# Patient Record
Sex: Female | Born: 1985 | Race: White | Hispanic: No | Marital: Single | Smoking: Never smoker
Health system: Southern US, Community
[De-identification: ages and names within clinical notes are randomized; demographics above are authoritative.]

## PROBLEM LIST (undated history)

## (undated) DIAGNOSIS — F419 Anxiety disorder, unspecified: Secondary | ICD-10-CM

## (undated) DIAGNOSIS — F32A Depression, unspecified: Secondary | ICD-10-CM

---

## 2007-09-26 ENCOUNTER — Ambulatory Visit: Payer: Self-pay | Admitting: Family Medicine

## 2007-09-26 DIAGNOSIS — R51 Headache: Secondary | ICD-10-CM

## 2007-09-26 DIAGNOSIS — R519 Headache, unspecified: Secondary | ICD-10-CM | POA: Insufficient documentation

## 2007-09-26 DIAGNOSIS — M545 Low back pain: Secondary | ICD-10-CM

## 2007-09-26 DIAGNOSIS — R21 Rash and other nonspecific skin eruption: Secondary | ICD-10-CM

## 2007-09-27 ENCOUNTER — Encounter: Payer: Self-pay | Admitting: Family Medicine

## 2007-10-02 ENCOUNTER — Ambulatory Visit: Payer: Self-pay | Admitting: Family Medicine

## 2007-10-23 ENCOUNTER — Ambulatory Visit: Payer: Self-pay | Admitting: Family Medicine

## 2007-10-23 DIAGNOSIS — N771 Vaginitis, vulvitis and vulvovaginitis in diseases classified elsewhere: Secondary | ICD-10-CM

## 2007-10-23 DIAGNOSIS — N926 Irregular menstruation, unspecified: Secondary | ICD-10-CM

## 2007-10-23 LAB — CONVERTED CEMR LAB
Beta hcg, urine, semiquantitative: NEGATIVE
KOH Prep: 0

## 2007-10-24 ENCOUNTER — Encounter: Payer: Self-pay | Admitting: Family Medicine

## 2007-10-24 LAB — CONVERTED CEMR LAB
Chlamydia, DNA Probe: NEGATIVE
GC Probe Amp, Genital: NEGATIVE

## 2014-12-05 NOTE — L&D Delivery Note (Addendum)
Delivery Note  First Stage: Labor onset: SROM at 0500 Augmentation : Pitocin  Analgesia /Anesthesia intrapartum: 2 doses of Stadol IVP and Epidural  SROM at 0500 at home - clear fluid, but progressed to light meconium throughout the day   Second Stage: Complete dilation at 1845 Onset of pushing at 1915 FHR second stage: category 2 tracing: fetal tachycardia with baseline in 160s -180s with pushing / variable and late decelerations during pushing to a nadir of 120 bpm  Delivery of a viable female "Victoria Cervantes" at 2012 by Victoria Cervantes CNM in LOA with compound left hand presentation / large amounts of thick meconium expressed during delivery of fetal head and body / Dr. Dalbert GarnetBeasley in the delivery room as back-up / NICU and nursery at bedside   No nuchal cord / cord around body  Cord double clamped after cessation of pulsation, cut by FOB Cord blood sample collected - mom AB Negative  Arterial cord blood sample - sent  Third Stage: Meconium-Stained Placenta delivered via [name] intact with 3 VC @ 2018 Placenta disposition: surgical pathology  Uterine tone firm with Pitocin and fundal massage/ bleeding was brisk, but then slowed to normal after pitocin and fundal massage   Right periclitoral laceration identified  Anesthesia for repair: epidural and 10 cc of 1% Lidocaine used  Repair: 3.0 vicryl with good hemostasis Est. Blood Loss (mL): 250cc  Complications: diagnosis of intra-amniotic infection during second stage pushing with maternal fever at 99.29F and maternal and fetal tachycardia with fetal baseline: 160s and increased to 180s with pushing / Tylenol was given during second stage - plan to treat as chorioamnionitis with Ampicillin 2 grams every 6 hours for 24 hours and Gentamycin 5mg /kg =350mg  for 1 dose   Mom to postpartum.  Baby to NICU.  Newborn: Birth Weight: 7#4.8oz  Apgar Scores: 8, 9 Feeding planned: Breast Cord Gas sent - pH 7.33  Neonatal NP at bedside for delivery -  performed oral and nares bulb suctioning - baby continued to grunt and oxygen levels were borderline, so baby taken to SCN   Victoria JewsMeredith Jerrel Tiberio, CNM

## 2015-06-18 DIAGNOSIS — Z3403 Encounter for supervision of normal first pregnancy, third trimester: Secondary | ICD-10-CM | POA: Insufficient documentation

## 2015-06-19 DIAGNOSIS — Z6791 Unspecified blood type, Rh negative: Secondary | ICD-10-CM

## 2015-06-19 DIAGNOSIS — O26899 Other specified pregnancy related conditions, unspecified trimester: Secondary | ICD-10-CM | POA: Insufficient documentation

## 2015-06-25 ENCOUNTER — Ambulatory Visit: Payer: Self-pay

## 2015-06-29 ENCOUNTER — Ambulatory Visit
Admission: RE | Admit: 2015-06-29 | Discharge: 2015-06-29 | Disposition: A | Discharge status: Home / Self Care | Payer: Medicaid Other | Source: Ambulatory Visit | Attending: Obstetrics and Gynecology | Admitting: Obstetrics and Gynecology

## 2015-06-29 ENCOUNTER — Ambulatory Visit: Payer: Self-pay

## 2015-06-29 ENCOUNTER — Inpatient Hospital Stay: Admission: RE | Admit: 2015-06-29 | Payer: Self-pay | Source: Ambulatory Visit

## 2015-06-29 DIAGNOSIS — O359XX0 Maternal care for (suspected) fetal abnormality and damage, unspecified, not applicable or unspecified: Secondary | ICD-10-CM | POA: Insufficient documentation

## 2015-06-29 DIAGNOSIS — Z3A18 18 weeks gestation of pregnancy: Secondary | ICD-10-CM | POA: Diagnosis not present

## 2015-06-29 LAB — US OB DETAIL + 14 WK

## 2015-07-02 ENCOUNTER — Other Ambulatory Visit: Payer: Self-pay

## 2015-11-04 DIAGNOSIS — B951 Streptococcus, group B, as the cause of diseases classified elsewhere: Secondary | ICD-10-CM | POA: Insufficient documentation

## 2015-11-22 ENCOUNTER — Encounter: Payer: Self-pay | Admitting: *Deleted

## 2015-11-22 ENCOUNTER — Inpatient Hospital Stay
Admission: RE | Admit: 2015-11-22 | Discharge: 2015-11-24 | DRG: 775 | Disposition: A | Discharge status: Home / Self Care | Payer: Medicaid Other | Source: Intra-hospital | Attending: Obstetrics and Gynecology | Admitting: Obstetrics and Gynecology

## 2015-11-22 ENCOUNTER — Inpatient Hospital Stay: Payer: Medicaid Other | Admitting: Anesthesiology

## 2015-11-22 DIAGNOSIS — Z3A39 39 weeks gestation of pregnancy: Secondary | ICD-10-CM | POA: Diagnosis not present

## 2015-11-22 DIAGNOSIS — O411230 Chorioamnionitis, third trimester, not applicable or unspecified: Secondary | ICD-10-CM | POA: Diagnosis present

## 2015-11-22 DIAGNOSIS — O326XX0 Maternal care for compound presentation, not applicable or unspecified: Principal | ICD-10-CM | POA: Diagnosis present

## 2015-11-22 DIAGNOSIS — O99824 Streptococcus B carrier state complicating childbirth: Secondary | ICD-10-CM | POA: Diagnosis present

## 2015-11-22 DIAGNOSIS — Z3A28 28 weeks gestation of pregnancy: Secondary | ICD-10-CM | POA: Diagnosis present

## 2015-11-22 LAB — TYPE AND SCREEN
ABO/RH(D): AB NEG
ANTIBODY SCREEN: POSITIVE

## 2015-11-22 LAB — CBC
HEMATOCRIT: 38.2 % (ref 35.0–47.0)
HEMOGLOBIN: 12.6 g/dL (ref 12.0–16.0)
MCH: 28.1 pg (ref 26.0–34.0)
MCHC: 32.8 g/dL (ref 32.0–36.0)
MCV: 85.6 fL (ref 80.0–100.0)
Platelets: 144 10*3/uL — ABNORMAL LOW (ref 150–440)
RBC: 4.47 MIL/uL (ref 3.80–5.20)
RDW: 14.4 % (ref 11.5–14.5)
WBC: 9.4 10*3/uL (ref 3.6–11.0)

## 2015-11-22 LAB — URINALYSIS COMPLETE WITH MICROSCOPIC (ARMC ONLY)
BILIRUBIN URINE: NEGATIVE
GLUCOSE, UA: NEGATIVE mg/dL
Nitrite: NEGATIVE
Protein, ur: 100 mg/dL — AB
SPECIFIC GRAVITY, URINE: 1.019 (ref 1.005–1.030)
pH: 6 (ref 5.0–8.0)

## 2015-11-22 LAB — ABO/RH: ABO/RH(D): AB NEG

## 2015-11-22 LAB — CHLAMYDIA/NGC RT PCR (ARMC ONLY)
Chlamydia Tr: NOT DETECTED
N GONORRHOEAE: NOT DETECTED

## 2015-11-22 MED ORDER — PENICILLIN G POTASSIUM 5000000 UNITS IJ SOLR
2.5000 10*6.[IU] | INTRAMUSCULAR | Status: DC
  Administered 2015-11-22 (×2): 2.5 10*6.[IU] via INTRAVENOUS
  Filled 2015-11-22 (×2): qty 2.5

## 2015-11-22 MED ORDER — MISOPROSTOL 200 MCG PO TABS
ORAL_TABLET | ORAL | Status: AC
  Filled 2015-11-22: qty 4

## 2015-11-22 MED ORDER — OXYTOCIN 40 UNITS IN LACTATED RINGERS INFUSION - SIMPLE MED
1.0000 m[IU]/min | INTRAVENOUS | Status: DC
  Administered 2015-11-22: 1 m[IU]/min via INTRAVENOUS

## 2015-11-22 MED ORDER — OXYCODONE-ACETAMINOPHEN 5-325 MG PO TABS: 1.0000 | ORAL_TABLET | ORAL | Status: DC | PRN

## 2015-11-22 MED ORDER — AMPICILLIN SODIUM 2 G IJ SOLR
2.0000 g | Freq: Four times a day (QID) | INTRAMUSCULAR | Status: AC
Start: 2015-11-22 — End: 2015-11-23
  Administered 2015-11-22 – 2015-11-23 (×4): 2 g via INTRAVENOUS
  Filled 2015-11-22 (×4): qty 2000

## 2015-11-22 MED ORDER — ONDANSETRON HCL 4 MG/2ML IJ SOLN: 4.0000 mg | INTRAMUSCULAR | Status: DC | PRN

## 2015-11-22 MED ORDER — FENTANYL 2.5 MCG/ML W/ROPIVACAINE 0.2% IN NS 100 ML EPIDURAL INFUSION (ARMC-ANES)
EPIDURAL | Status: AC
  Filled 2015-11-22: qty 100

## 2015-11-22 MED ORDER — OXYTOCIN 10 UNIT/ML IJ SOLN
INTRAMUSCULAR | Status: AC
  Filled 2015-11-22: qty 2

## 2015-11-22 MED ORDER — ONDANSETRON HCL 4 MG PO TABS: 4.0000 mg | ORAL_TABLET | ORAL | Status: DC | PRN

## 2015-11-22 MED ORDER — OXYTOCIN 40 UNITS IN LACTATED RINGERS INFUSION - SIMPLE MED
62.5000 mL/h | INTRAVENOUS | Status: DC
  Administered 2015-11-22: 62.5 mL/h via INTRAVENOUS
  Filled 2015-11-22: qty 1000

## 2015-11-22 MED ORDER — PENICILLIN G POTASSIUM 5000000 UNITS IJ SOLR
5.0000 10*6.[IU] | Freq: Once | INTRAVENOUS | Status: AC
  Administered 2015-11-22: 5 10*6.[IU] via INTRAVENOUS
  Filled 2015-11-22: qty 5

## 2015-11-22 MED ORDER — IBUPROFEN 600 MG PO TABS
600.0000 mg | ORAL_TABLET | Freq: Four times a day (QID) | ORAL | Status: DC
  Administered 2015-11-23 – 2015-11-24 (×7): 600 mg via ORAL
  Filled 2015-11-22 (×7): qty 1

## 2015-11-22 MED ORDER — OXYTOCIN BOLUS FROM INFUSION
500.0000 mL | INTRAVENOUS | Status: DC
  Administered 2015-11-22: 500 mL via INTRAVENOUS

## 2015-11-22 MED ORDER — ZOLPIDEM TARTRATE 5 MG PO TABS: 5.0000 mg | ORAL_TABLET | Freq: Every evening | ORAL | Status: DC | PRN

## 2015-11-22 MED ORDER — LANOLIN HYDROUS EX OINT: TOPICAL_OINTMENT | CUTANEOUS | Status: DC | PRN

## 2015-11-22 MED ORDER — LACTATED RINGERS IV SOLN
INTRAVENOUS | Status: DC
  Administered 2015-11-22: 125 mL/h via INTRAVENOUS
  Administered 2015-11-23: 09:00:00 via INTRAVENOUS

## 2015-11-22 MED ORDER — SENNOSIDES-DOCUSATE SODIUM 8.6-50 MG PO TABS
2.0000 | ORAL_TABLET | ORAL | Status: DC
  Administered 2015-11-24: 2 via ORAL
  Filled 2015-11-22: qty 2

## 2015-11-22 MED ORDER — BENZOCAINE-MENTHOL 20-0.5 % EX AERO: 1.0000 "application " | INHALATION_SPRAY | CUTANEOUS | Status: DC | PRN

## 2015-11-22 MED ORDER — GENTAMICIN SULFATE 40 MG/ML IJ SOLN
350.0000 mg | Freq: Once | INTRAVENOUS | Status: AC
  Administered 2015-11-23: 350 mg via INTRAVENOUS
  Filled 2015-11-22: qty 8.75

## 2015-11-22 MED ORDER — LIDOCAINE HCL (PF) 1 % IJ SOLN
30.0000 mL | INTRAMUSCULAR | Status: DC | PRN
  Administered 2015-11-22: 30 mL via SUBCUTANEOUS
  Filled 2015-11-22: qty 30

## 2015-11-22 MED ORDER — ACETAMINOPHEN 325 MG PO TABS
650.0000 mg | ORAL_TABLET | ORAL | Status: DC | PRN
  Administered 2015-11-22: 650 mg via ORAL
  Filled 2015-11-22: qty 2

## 2015-11-22 MED ORDER — LACTATED RINGERS IV SOLN: 500.0000 mL | INTRAVENOUS | Status: DC | PRN

## 2015-11-22 MED ORDER — WITCH HAZEL-GLYCERIN EX PADS: 1.0000 "application " | MEDICATED_PAD | CUTANEOUS | Status: DC | PRN

## 2015-11-22 MED ORDER — BUTORPHANOL TARTRATE 1 MG/ML IJ SOLN
1.0000 mg | INTRAMUSCULAR | Status: DC | PRN
  Administered 2015-11-22 (×2): 1 mg via INTRAVENOUS
  Filled 2015-11-22 (×2): qty 1

## 2015-11-22 MED ORDER — DIPHENHYDRAMINE HCL 25 MG PO CAPS: 25.0000 mg | ORAL_CAPSULE | Freq: Four times a day (QID) | ORAL | Status: DC | PRN

## 2015-11-22 MED ORDER — DIBUCAINE 1 % RE OINT: 1.0000 "application " | TOPICAL_OINTMENT | RECTAL | Status: DC | PRN

## 2015-11-22 MED ORDER — SIMETHICONE 80 MG PO CHEW: 80.0000 mg | CHEWABLE_TABLET | ORAL | Status: DC | PRN

## 2015-11-22 MED ORDER — BUPIVACAINE HCL (PF) 0.25 % IJ SOLN
INTRAMUSCULAR | Status: DC | PRN
  Administered 2015-11-22: 10 mL via EPIDURAL

## 2015-11-22 MED ORDER — TERBUTALINE SULFATE 1 MG/ML IJ SOLN: 0.2500 mg | Freq: Once | INTRAMUSCULAR | Status: DC | PRN

## 2015-11-22 MED ORDER — OXYCODONE-ACETAMINOPHEN 5-325 MG PO TABS
2.0000 | ORAL_TABLET | ORAL | Status: DC | PRN
  Administered 2015-11-23 – 2015-11-24 (×2): 2 via ORAL
  Filled 2015-11-22 (×2): qty 2

## 2015-11-22 MED ORDER — LIDOCAINE-EPINEPHRINE (PF) 1.5 %-1:200000 IJ SOLN
INTRAMUSCULAR | Status: DC | PRN
  Administered 2015-11-22: 3 mL via PERINEURAL

## 2015-11-22 MED ORDER — ONDANSETRON HCL 4 MG/2ML IJ SOLN: 4.0000 mg | Freq: Four times a day (QID) | INTRAMUSCULAR | Status: DC | PRN

## 2015-11-22 MED ORDER — PRENATAL MULTIVITAMIN CH
1.0000 | ORAL_TABLET | Freq: Every day | ORAL | Status: DC
  Administered 2015-11-24: 1 via ORAL
  Filled 2015-11-22: qty 1

## 2015-11-22 MED ORDER — FENTANYL 2.5 MCG/ML W/ROPIVACAINE 0.2% IN NS 100 ML EPIDURAL INFUSION (ARMC-ANES)
EPIDURAL | Status: AC
  Administered 2015-11-22: 10 mL/h via EPIDURAL
  Filled 2015-11-22: qty 100

## 2015-11-22 MED ORDER — ACETAMINOPHEN 325 MG PO TABS: 650.0000 mg | ORAL_TABLET | ORAL | Status: DC | PRN

## 2015-11-22 NOTE — Progress Notes (Signed)
S:  More uncomfortable and requesting pain medication       Received 500cc LR Bolus   O:  VS: Blood pressure 131/66, pulse 86, temperature 97.6 F (36.4 C), temperature source Oral, resp. rate 14, last menstrual period 02/17/2015.        FHR : baseline 140 / variability Moderate/ accelerations + / no decelerations        Toco: contractions every 3-5 minutes / Moderate         Cervix : Dilation: 3 Effacement (%): 80 Station: -3 Presentation: Vertex Exam by:: ess        Membranes: SROM  A: Latent labor     FHR category 2  P: Stadol IVP given x 1     Continue expectant management     Limit vaginal exams d/t rupture      Anticipate NSVD  Carlean JewsMeredith Sigmon, CNM

## 2015-11-22 NOTE — Plan of Care (Signed)
Pt sitting up for epidural

## 2015-11-22 NOTE — Progress Notes (Signed)
Pt assisted to bathroom, voided, shown pericare.   Clean pad icepack and gown on.  She the sat in w/c in stable condition

## 2015-11-22 NOTE — Progress Notes (Addendum)
S:  Breathing well through contractions, and had one more dose of Stadol / still does not want epidural at this time  O:  VS: Blood pressure 130/76, pulse 82, temperature 98.1 F (36.7 C), temperature source Oral, resp. rate 14, last menstrual period 02/17/2015.        FHR : baseline 140 bpm / variability Moderate / accelerations + / occasional early decelerations        Toco: contractions every 5-7nutes / moderate        Cervix : deferred        Membranes: SROM - light mec per RN last exam  A: Latent Labor      FHR category 2     GBS positive  P: Continue management - discussed starting low-dose Pitocin to get contractions stronger and closer together      Has received 2 doses of abx      Re-assess in 1-2 hours     Anticipate NSVD  Carlean JewsMeredith Bruna Dills, CNM

## 2015-11-22 NOTE — Progress Notes (Signed)
S:  Assessing patient again and she states the IV pain medication is not as strong and would like an epidural   O:  VS: Blood pressure 130/76, pulse 82, temperature 98.1 F (36.7 C), temperature source Oral, resp. rate 14, last menstrual period 02/17/2015.        FHR : baseline 130- 135 / variability Moderate / accelerations + / no decelerations        Toco: contractions every 5-7 minutes / moderate        Cervix : 4.5cm/70%/-2/vtx        Membranes: SROM - light meconium stained fluid on towels now since 1015 am   A: Latent labor     FHR category 1     GBS positive   P: Discussed meconium stained fluid with patient and FOB - FHR reassuring at this time and if things change, we will plan on placing an IUPC - discussed risks and benefits - they are amenable to this plan      May get an epidural     Begin low-dose Pitocin - start at 1 milliunit and increase by 2 milliunits     Reassess in 1-2 hours     Dr. Dalbert GarnetBeasley updated on status      Carlean JewsMeredith Ardie Mclennan, CNM

## 2015-11-22 NOTE — Progress Notes (Signed)
S:  Pt. Is comfortable with her epidural on her right side        Discussed with pt. That we were having some difficulty tracing contractions and with her being 4-5 cm since about 1pm, we need to place an IUPC to measure the quality of contractions - educated her on the risks and benefits and she is okay to proceed.  We also discussed placing an FSE to monitor fetal heart rate and she agrees to that as well - again reviewed risks and benefits  O:  VS: Blood pressure 100/52, pulse 91, temperature 97.8 F (36.6 C), temperature source Oral, resp. rate 14, last menstrual period 02/17/2015.        FHR : baseline 145 / variability minimal-moderate / accelerations none / occasional early and variable decelerations        Toco: contractions every 1-6 minutes / palpating mild to moderate/ MVUs: 180         Cervix : 5cm/70%/-2/vtx        Membranes: AROM - light meconium   A: Latent labor     FHR category 2     GBS positive - has received 3 doses of abx   P: IUPC placed with ease      Failed attempt at an FSE - did not repeat / will continue monitoring FHR externally     Continue frequent position changes      LR 500cc bolus after dose of antibiotics are in for minimal variability      Dr. Dalbert GarnetBeasley updated and aware      Anticipate NSVD  Carlean JewsMeredith Winifred Balogh, CNM

## 2015-11-22 NOTE — Progress Notes (Signed)
S:  Pt. Reports stronger contractions now and rating them 6/10       Discussed pain control and pt. Desires IV pain medication / she is not opposed to epidural, but is hoping to not get it  O:  VS: Blood pressure 126/72, pulse 86, temperature 97.6 F (36.4 C), temperature source Oral, resp. rate 14, last menstrual period 02/17/2015.        FHR : baseline 140 bpm / variability Moderate/ accelerations 10x10 / no decelerations        Toco: contractions every 2-4.5 minutes / moderate to palpate        Cervix : Dilation: 1 Effacement (%): 50 Station: -3 Presentation: Vertex Exam by:: ess        Membranes: SROM at home @0500  - clear fluid   A: Latent labor     FHR category 2 - reassuring, but not reactive     GBS positive  P: First dose of PCN going now     Stadol IVP PRN     500cc IV fluid bolus after antibiotic dose      Hold off on Pitocin augmentation for now d/t pt. Spontaneously contracting     Reassess in 1-2 hours     Anticipate NSVD  Carlean JewsMeredith Orlena Garmon, CNM

## 2015-11-22 NOTE — Progress Notes (Signed)
Patient ID: Victoria Cervantes, female   DOB: 05/31/1986, 29 y.o.   MRN: [id number]  Addendum to delivery note by Carlean JewsMeredith Sigmon, CNM:  I was present and scrubbed at the perineum for the entirety of the vaginal delivery procedure.  Cat II strip with excellent maternal pushing effort, fetal tachycardia with maternal fever treated with acetaminophen prior to delivery and no recorded temp in the computer. Dx of IAI made during pushing. Abx arrived to begin maternal treatment after delivery.  Compound presentation with thick meconium. Cord gas, cord blood and placenta sent to pathology. Maternal bleeding normal with firm fundus and postpartum pitocin given.  Baby with apgars of 8 and 9 at 1 and 5 min respectively. Some grunting after delivery and baby taken to SCN.  Christeen DouglasBethany Lezlee Gills, MD

## 2015-11-22 NOTE — Anesthesia Preprocedure Evaluation (Signed)
Anesthesia Evaluation  Patient identified by MRN, date of birth, ID band Patient awake    Reviewed: Allergy & Precautions, NPO status , Patient's Chart, lab work & pertinent test results  Airway Mallampati: II   Neck ROM: Full    Dental no notable dental hx.    Pulmonary neg pulmonary ROS,    Pulmonary exam normal breath sounds clear to auscultation       Cardiovascular negative cardio ROS Normal cardiovascular exam     Neuro/Psych  Headaches, negative psych ROS   GI/Hepatic negative GI ROS, Neg liver ROS,   Endo/Other  negative endocrine ROS  Renal/GU negative Renal ROS  negative genitourinary   Musculoskeletal negative musculoskeletal ROS (+)   Abdominal Normal abdominal exam  (+)   Peds negative pediatric ROS (+)  Hematology negative hematology ROS (+)   Anesthesia Other Findings   Reproductive/Obstetrics (+) Pregnancy                             Anesthesia Physical Anesthesia Plan  ASA: II  Anesthesia Plan: Epidural   Post-op Pain Management:    Induction:   Airway Management Planned:   Additional Equipment:   Intra-op Plan:   Post-operative Plan:   Informed Consent: I have reviewed the patients History and Physical, chart, labs and discussed the procedure including the risks, benefits and alternatives for the proposed anesthesia with the patient or authorized representative who has indicated his/her understanding and acceptance.   Dental advisory given  Plan Discussed with: Surgeon  Anesthesia Plan Comments:         Anesthesia Quick Evaluation

## 2015-11-22 NOTE — H&P (Signed)
  OB ADMISSION/ HISTORY & PHYSICAL:  Admission Date: 11/22/2015  5:31 AM  Admit Diagnosis: Spontaneous rupture of membranes at 39+5 weeks  Victoria Cervantes is a 29 y.o. female presenting for spontaneous rupture of membranes at 39+5 weeks.  She reports her "water broke around 5am," but denies frequent, strong contractions.  She came directly to the hospital because she is GBS positive.   Prenatal History: G1P0   EDC : 11/24/2015, by Last Menstrual Period of 02/17/15 [facility]  Prenatal course complicated by late entry to prenatal care at 17 weeks, Rh Negative, GBS positive  Prenatal Labs: ABO, Rh:  AB Negative Antibody:  Negative Rubella:   Immune RPR:   NR HBsAg:   NR HIV:   NR GTT: 103 GBS:   Positive   Medical / Surgical History :  Past medical history: History reviewed. No pertinent past medical history.   Past surgical history: History reviewed. No pertinent past surgical history.  Family History: History reviewed. No pertinent family history.   Social History:  reports that she has never smoked. She has never used smokeless tobacco. She reports that she does not drink alcohol or use illicit drugs.   Allergies: Review of patient's allergies indicates no known allergies.    Current Medications at time of admission:  Prior to Admission medications   Medication Sig Start Date End Date Taking? Authorizing Provider  ferrous fumarate (HEMOCYTE - 106 MG FE) 325 (106 FE) MG TABS tablet Take 1 tablet by mouth.   Yes Historical Provider, MD  acetaminophen (TYLENOL) 325 MG tablet Take 650 mg by mouth every 6 (six) hours as needed for mild pain.    Historical Provider, MD  Prenatal Vit-Fe Fumarate-FA (PRENATAL MULTIVITAMIN) TABS tablet Take 1 tablet by mouth daily at 12 noon.    Historical Provider, MD     Review of Systems: Active FM onset of ctx @ 0500 currently every 6-10 minutes LOF  / SROM @ 0500 bloody show: none   Physical Exam:  VS: Blood  pressure 131/83, pulse 97, temperature 98 F (36.7 C), temperature source Oral, resp. rate 14, last menstrual period 02/17/2015.  General: alert and oriented, appears calm Heart: RRR Lungs: Clear lung fields Abdomen: Gravid, soft and non-tender, non-distended / uterus: gravid, non-tender / Leopold's EFW: 7#6oz/ vtx. Extremities: no edema  Genitalia / VE: Dilation: 1 Effacement (%): 50 Station: -3 Exam by:: ess  FHR: baseline rate 130-135 bpm / variability Moderate / accelerations + / no decelerations TOCO: every 2- 4.5 mintues  Assessment: 39+[redacted] weeks gestation Latent stage of labor FHR category 1 SROM  GBS positive - begin PCN prophylaxis  RH Negative - received Rhogam at 28 weeks, needs PP if baby Rh positive Late entry into prenatal care at 17 weeks    Plan:  Admit to Birth Place Routine Antenatal orders  PCN prophylaxis for GBS positive  May have Stadol PRN  Continuous fetal monitoring  Begin Pitocin IV: start at 1 milliunit and increase by 2 milliunits  Anticipate NSVD  Dr. Dalbert GarnetBeasley notified of admission / plan of care  Carlean JewsMeredith Shatisha Falter, CNM

## 2015-11-22 NOTE — Progress Notes (Signed)
Pt pushing effectively.  Dr. Dalbert GarnetBeasley at bedside as well with Joelene MillinM. Sigmon, CNM.   Delivered at 2013 with large amt of meconium following.

## 2015-11-22 NOTE — Progress Notes (Signed)
Pt transferred via w/c to rm [id number]

## 2015-11-22 NOTE — Anesthesia Procedure Notes (Signed)
Epidural Patient location during procedure: OB Start time: 11/22/2015 3:30 PM End time: 11/22/2015 3:41 PM  Staffing Anesthesiologist: Yves DillARROLL, Saje Gallop  Preanesthetic Checklist Completed: patient identified, site marked, surgical consent, pre-op evaluation, timeout performed, IV checked, risks and benefits discussed and monitors and equipment checked  Epidural Patient position: sitting Prep: Betadine and site prepped and draped Patient monitoring: heart rate, cardiac monitor, continuous pulse ox and blood pressure Approach: midline Location: L3-L4 Injection technique: LOR air  Needle:  Needle type: Tuohy  Needle gauge: 18 G Needle length: 9 cm Catheter type: closed end Catheter size: 20 Guage Test dose: negative and 1.5% lidocaine with Epi 1:200 K  Assessment Sensory level: T8  Additional Notes Time out called.  Patient placed in sitting position and prepped and draped in sterile fashion.  Epidural as above... Easy X 1 .  The catheter was threaded 3cm and the TD was negative.  The patient tolreated the procedure well.Reason for block:procedure for pain

## 2015-11-23 ENCOUNTER — Encounter: Payer: Self-pay | Admitting: *Deleted

## 2015-11-23 LAB — FETAL SCREEN: Fetal Screen: NEGATIVE

## 2015-11-23 LAB — RPR: RPR Ser Ql: NONREACTIVE

## 2015-11-23 LAB — CBC
HCT: 33.4 % — ABNORMAL LOW (ref 35.0–47.0)
HEMOGLOBIN: 11 g/dL — AB (ref 12.0–16.0)
MCH: 28.1 pg (ref 26.0–34.0)
MCHC: 32.9 g/dL (ref 32.0–36.0)
MCV: 85.3 fL (ref 80.0–100.0)
Platelets: 122 10*3/uL — ABNORMAL LOW (ref 150–440)
RBC: 3.92 MIL/uL (ref 3.80–5.20)
RDW: 14.3 % (ref 11.5–14.5)
WBC: 11.5 10*3/uL — ABNORMAL HIGH (ref 3.6–11.0)

## 2015-11-23 MED ORDER — BREAST MILK: ORAL | Status: DC

## 2015-11-23 MED ORDER — RHO D IMMUNE GLOBULIN 1500 UNIT/2ML IJ SOSY
300.0000 ug | PREFILLED_SYRINGE | Freq: Once | INTRAMUSCULAR | Status: AC
  Administered 2015-11-23: 300 ug via INTRAVENOUS
  Filled 2015-11-23: qty 2

## 2015-11-23 NOTE — Anesthesia Postprocedure Evaluation (Signed)
Anesthesia Post Note  Patient: Victoria Cervantes  Procedure(s) Performed: * No procedures listed *  Patient location during evaluation: Mother Baby Anesthesia Type: Epidural Level of consciousness: awake, oriented and awake and alert Pain management: pain level controlled Vital Signs Assessment: vitals unstable Respiratory status: spontaneous breathing and nonlabored ventilation Cardiovascular status: stable Postop Assessment: no headache, no backache and no signs of nausea or vomiting Anesthetic complications: no    Last Vitals:  Filed Vitals:   11/23/15 0340 11/23/15 0713  BP: 121/70 122/73  Pulse: 78 71  Temp: 36.7 C 37.1 C  Resp: 18 20    Last Pain:  Filed Vitals:   11/23/15 0714  PainSc: 0-No pain                 Ginger CarneStephanie Deiondra Denley

## 2015-11-23 NOTE — Progress Notes (Signed)
Patient desires to breastfeed. Infant in SCN at this time. Set patient up with breast pump. Taught patient how to set pump up, program it appropriately, and clean it after. Patient return demonstrated and verbalized understanding. Pumping now.

## 2015-11-23 NOTE — Progress Notes (Signed)
Post Partum Day 1 Subjective: no complaints, up ad lib, voiding, tolerating PO, + flatus and breastfeeding. No fevers, chills, abnormal discharge.  Objective: Blood pressure 122/73, pulse 71, temperature 98.7 F (37.1 C), temperature source Oral, resp. rate 20, last menstrual period 02/17/2015, SpO2 99 %, unknown if currently breastfeeding.  Physical Exam:  General: alert and no distress Lochia: appropriate Uterine Fundus: firm, non-tender  Incision: N/A DVT Evaluation: No evidence of DVT seen on physical exam.   Recent Labs  11/22/15 0624 11/23/15 0543  HGB 12.6 11.0*  HCT 38.2 33.4*    Assessment/Plan: Plan for discharge tomorrow and Contraception Nexplanon  No evidence of endometritis AB-: Newborn A+, needs Rhogam prior to discharge   LOS: 1 day   GOODMAN, DAVID MICHAEL 11/23/2015, 9:36 AM

## 2015-11-24 LAB — RHOGAM INJECTION: UNIT DIVISION: 0

## 2015-11-24 LAB — SURGICAL PATHOLOGY

## 2015-11-24 MED ORDER — IBUPROFEN 600 MG PO TABS: 600.0000 mg | ORAL_TABLET | Freq: Four times a day (QID) | ORAL | Status: DC

## 2015-11-24 NOTE — Final Progress Note (Signed)
Discharge instructions complete. Patient verbalizes understanding of instructions. Patient is being discharged at 1600, however, will remain in her current room to "room in" with her baby. Patient verbalizes understanding that she will no longer be a patient, although her baby will remain a patient.

## 2015-11-24 NOTE — Discharge Instructions (Signed)
Please call your doctor or return to the ER if you experience any chest pains, shortness of breath, fever greater than 101, any heavy bleeding or large clots, and foul smelling vaginal discharge, any worsening abdominal pain & cramping that is not controlled by pain medication, or any signs of post partum depression.  No tampons, enemas, douches, or sexual intercourse for 6 weeks.  Also avoid tub baths, hot tubs, or swimming for 6 weeks. ° ° ° °Postpartum Care After Vaginal Delivery °After you deliver your newborn (postpartum period), the usual stay in the hospital is 24-72 hours. If there were problems with your labor or delivery, or if you have other medical problems, you might be in the hospital longer.  °While you are in the hospital, you will receive help and instructions on how to care for yourself and your newborn during the postpartum period.  °While you are in the hospital: °· Be sure to tell your nurses if you have pain or discomfort, as well as where you feel the pain and what makes the pain worse. °· If you had an incision made near your vagina (episiotomy) or if you had some tearing during delivery, the nurses may put ice packs on your episiotomy or tear. The ice packs may help to reduce the pain and swelling. °· If you are breastfeeding, you may feel uncomfortable contractions of your uterus for a couple of weeks. This is normal. The contractions help your uterus get back to normal size. °· It is normal to have some bleeding after delivery. °¨ For the first 1-3 days after delivery, the flow is red and the amount may be similar to a period. °¨ It is common for the flow to start and stop. °¨ In the first few days, you may pass some small clots. Let your nurses know if you begin to pass large clots or your flow increases. °¨ Do not  flush blood clots down the toilet before having the nurse look at them. °¨ During the next 3-10 days after delivery, your flow should become more watery and pink or  brown-tinged in color. °¨ Ten to fourteen days after delivery, your flow should be a small amount of yellowish-white discharge. °¨ The amount of your flow will decrease over the first few weeks after delivery. Your flow may stop in 6-8 weeks. Most women have had their flow stop by 12 weeks after delivery. °· You should change your sanitary pads frequently. °· Wash your hands thoroughly with soap and water for at least 20 seconds after changing pads, using the toilet, or before holding or feeding your newborn. °· You should feel like you need to empty your bladder within the first 6-8 hours after delivery. °· In case you become weak, lightheaded, or faint, call your nurse before you get out of bed for the first time and before you take a shower for the first time. °· Within the first few days after delivery, your breasts may begin to feel tender and full. This is called engorgement. Breast tenderness usually goes away within 48-72 hours after engorgement occurs. You may also notice milk leaking from your breasts. If you are not breastfeeding, do not stimulate your breasts. Breast stimulation can make your breasts produce more milk. °· Spending as much time as possible with your newborn is very important. During this time, you and your newborn can feel close and get to know each other. Having your newborn stay in your room (rooming in) will help to strengthen   the bond with your newborn.  It will give you time to get to know your newborn and become comfortable caring for your newborn. °· Your hormones change after delivery. Sometimes the hormone changes can temporarily cause you to feel sad or tearful. These feelings should not last more than a few days. If these feelings last longer than that, you should talk to your caregiver. °· If desired, talk to your caregiver about methods of family planning or contraception. °· Talk to your caregiver about immunizations. Your caregiver may want you to have the following  immunizations before leaving the hospital: °¨ Tetanus, diphtheria, and pertussis (Tdap) or tetanus and diphtheria (Td) immunization. It is very important that you and your family (including grandparents) or others caring for your newborn are up-to-date with the Tdap or Td immunizations. The Tdap or Td immunization can help protect your newborn from getting ill. °¨ Rubella immunization. °¨ Varicella (chickenpox) immunization. °¨ Influenza immunization. You should receive this annual immunization if you did not receive the immunization during your pregnancy. °  °This information is not intended to replace advice given to you by your health care provider. Make sure you discuss any questions you have with your health care provider. °  °Document Released: 09/18/2007 Document Revised: 08/15/2012 Document Reviewed: 07/18/2012 °Elsevier Interactive Patient Education ©2016 [employer] ° °

## 2015-11-24 NOTE — Discharge Summary (Signed)
Obstetric Discharge Summary Reason for Admission: onset of labor Prenatal Procedures: none Intrapartum Procedures: spontaneous vaginal delivery Postpartum Procedures: antibiotics and due to chorioamnionitis Complications-Operative and Postpartum: none HEMOGLOBIN  Date Value Ref Range Status  11/23/2015 11.0* 12.0 - 16.0 g/dL Final   HCT  Date Value Ref Range Status  11/23/2015 33.4* 35.0 - 47.0 % Final    Physical Exam:  General: alert and no distress Lochia: appropriate Uterine Fundus: firm, non-tender Incision: none DVT Evaluation: No evidence of DVT seen on physical exam.  Discharge Diagnoses: Term Pregnancy-delivered and Amnionitis  Discharge Information: Date: 11/24/2015 Activity: unrestricted and pelvic rest Diet: routine Medications: see med rec Condition: stable Instructions: refer to practice specific booklet Discharge to: home Follow-up Information    Follow up In 6 weeks.   Why:  Planned for Nexplanon      Newborn Data: Live born female  Birth Weight: 7 lb 4.8 oz (3310 g) APGAR: 8, 9  Home with mother.  Victoria Cervantes MICHAEL 11/24/2015, 7:21 AM

## 2015-11-25 ENCOUNTER — Ambulatory Visit: Payer: Self-pay

## 2015-11-25 NOTE — Lactation Note (Signed)
This note was copied from the chart of Girl Finnley Faraone. Lactation Consultation Note  Patient Name: Girl Marjean DonnaGalina Disanti Today's Date: 11/25/2015     Maternal Data  Mom states baby nursing well, but had two sleepy episodes last night. I reminded her of skin to skin and breast compression to help make feeds more efficient. She denies pain or needing any more teaching.   Feeding  Baby has not had a BM last 24 hours, but ample voids and only 3% weight loss. I reviewed signs of adequate feeds (page 21 of BF booklet) and when to call MD/LC for help. They plan to go home today. She has been pumping and slow flow bottle feeding breastmilk/formula with any poor breast feeds.   [facility]                      Lactation Tools Discussed/Used     Consult Status      Sunday CornSandra Clark Signe Tackitt 11/25/2015, 10:19 AM

## 2016-05-04 DIAGNOSIS — F32A Depression, unspecified: Secondary | ICD-10-CM | POA: Insufficient documentation

## 2016-05-04 DIAGNOSIS — R519 Headache, unspecified: Secondary | ICD-10-CM | POA: Insufficient documentation

## 2016-05-04 DIAGNOSIS — F419 Anxiety disorder, unspecified: Secondary | ICD-10-CM | POA: Insufficient documentation

## 2016-05-04 DIAGNOSIS — F329 Major depressive disorder, single episode, unspecified: Secondary | ICD-10-CM | POA: Insufficient documentation

## 2016-05-04 DIAGNOSIS — R51 Headache: Secondary | ICD-10-CM

## 2016-06-02 ENCOUNTER — Encounter: Payer: Self-pay | Admitting: Podiatry

## 2016-06-16 ENCOUNTER — Ambulatory Visit (INDEPENDENT_AMBULATORY_CARE_PROVIDER_SITE_OTHER): Payer: Medicaid Other | Admitting: Podiatry

## 2016-06-16 ENCOUNTER — Encounter: Payer: Self-pay | Admitting: Podiatry

## 2016-06-16 VITALS — BP 114/72 | HR 77 | Resp 16 | Ht 63.0 in | Wt 160.0 lb

## 2016-06-16 DIAGNOSIS — L600 Ingrowing nail: Secondary | ICD-10-CM | POA: Diagnosis not present

## 2016-06-16 NOTE — Progress Notes (Signed)
   Subjective:    Patient ID: Victoria Cervantes, female    DOB: [birth year], 30 y.o.   MRN: [id number]  HPI Chief Complaint  Patient presents with  . Nail Problem    Bilateral; great toes-both sides; x5910 yrs   30 year old female presents the office today for concerns of ingrown toenails to both of her big toes which been ongoing for several years. She tries to trim them herself. She presented did have a partial nail avulsion performed on the right foot several years ago and the digit come back causing pain. She call he denies any drainage or redness to the toenails however it is painful. Joint pain is with pressure in shoes. No other complaints at this time.   Review of Systems  All other systems reviewed and are negative.      Objective:   Physical Exam General: AAO x3, NAD  Dermatological: Incurvation of both the medial and lateral nail borders of bilateral hallux with tenderness to palpation on the nail borders. There is less edema around the nail borders have there is no erythema or increase in warmth. No drainage or pus.  Vascular: Dorsalis Pedis artery and Posterior Tibial artery pedal pulses are 2/4 bilateral with immedate capillary fill time. Pedal hair growth present.  There is no pain with calf compression, swelling, warmth, erythema.   Neruologic: Grossly intact via light touch bilateral. Vibratory intact via tuning fork bilateral. Protective threshold with Semmes Wienstein monofilament intact to all pedal sites bilateral.   Musculoskeletal: Tenderness on the medial and lateral nail borders bilateral hallux. No other areas of tenderness bilaterally. No pain, crepitus, or limitation noted with foot and ankle range of motion bilateral. Muscular strength 5/5 in all groups tested bilateral.  Gait: Unassisted, Nonantalgic.      Assessment & Plan:  30 year old female bilateral medial/lateral hallux ingrown toenail, symptomatic -Treatment options discussed including all  alternatives, risks, and complications -Etiology of symptoms were discussed -At this time, the patient is requesting partial nail removal with chemical matricectomy to the symptomatic portion of the nail. Risks and complications were discussed with the patient for which they understand and  verbally consent to the procedure. Under sterile conditions a total of 3 mL of a mixture of 2% lidocaine plain and 0.5% Marcaine plain was infiltrated in a hallux block fashion. Once anesthetized, the skin was prepped in sterile fashion. A tourniquet was then applied. Next the medial and lateral aspect of hallux nail borders were then sharply excised making sure to remove the entire offending nail border. Once the nails were ensured to be removed area was debrided and the underlying skin was intact. There is no purulence identified in the procedure. Next phenol was then applied under standard conditions and copiously irrigated. Silvadene was applied. A dry sterile dressing was applied. After application of the dressing the tourniquet was removed and there is found to be an immediate capillary refill time to the digit. The patient tolerated the procedure well any complications. Post procedure instructions were discussed the patient for which he verbally understood. Follow-up in one week for nail check or sooner if any problems are to arise. Discussed signs/symptoms of infection and directed to call the office immediately should any occur or go directly to the emergency room. In the meantime, encouraged to call the office with any questions, concerns, changes symptoms.  Ovid CurdMatthew Wagoner, DPM

## 2016-06-16 NOTE — Patient Instructions (Signed)

## 2016-06-19 DIAGNOSIS — L600 Ingrowing nail: Secondary | ICD-10-CM | POA: Insufficient documentation

## 2016-06-23 ENCOUNTER — Ambulatory Visit: Payer: Self-pay | Admitting: Podiatry

## 2016-06-23 ENCOUNTER — Ambulatory Visit (INDEPENDENT_AMBULATORY_CARE_PROVIDER_SITE_OTHER): Payer: Medicaid Other | Admitting: Podiatry

## 2016-06-23 ENCOUNTER — Encounter: Payer: Self-pay | Admitting: Podiatry

## 2016-06-23 DIAGNOSIS — Z9889 Other specified postprocedural states: Secondary | ICD-10-CM

## 2016-06-23 DIAGNOSIS — L600 Ingrowing nail: Secondary | ICD-10-CM

## 2016-06-23 NOTE — Patient Instructions (Signed)

## 2016-06-24 NOTE — Progress Notes (Signed)
Patient ID: Victoria Cervantes, female   DOB: 10-30-1986, 30 y.o.   MRN: [id number]  Subjective: Victoria Cervantes is a 30 y.o.  female returns to office today for follow up evaluation after having left and right hallux partial permanent nail avulsion performed. Patient has been soaking using epsom salts and applying topical antibiotic covered with bandaid daily. Denies any drainage or pus turns any redness or red streaks. She gets some throbbing and a this is improving. Patient denies fevers, chills, nausea, vomiting. Denies any calf pain, chest pain, SOB.   Objective:  Vitals: Reviewed  General: Well developed, nourished, in no acute distress, alert and oriented x3   Dermatology: Skin is warm, dry and supple bilateral. Bilateral hallux nail border appears to be clean, dry, with mild granular tissue and surrounding scab. There is no surrounding erythema, edema, drainage/purulence. The remaining nails appear unremarkable at this time. There are no other lesions or other signs of infection present.  Neurovascular status: Intact. No lower extremity swelling; No pain with calf compression bilateral.  Musculoskeletal: No tenderness to palpation of the left and right hallux nail folds. Muscular strength within normal limits bilateral.   Assesement and Plan: S/p partial nail avulsion, doing well.   -Continue soaking in epsom salts twice a day followed by antibiotic ointment and a band-aid. Can leave uncovered at night. Continue this until completely healed.  -If the area has not healed in 2 weeks, call the office for follow-up appointment, or sooner if any problems arise.  -Monitor for any signs/symptoms of infection. Call the office immediately if any occur or go directly to the emergency room. Call with any questions/concerns.  Ovid CurdMatthew Nadie Fiumara, DPM

## 2017-07-25 IMAGING — US US OB DETAIL+14 WK
1 series · 14 of 28 positions shown · non-contrast
Comparison: none

[Series 1: us ob detail+14 wk · 0.23mm/px · 14 of 73 slices shown]
[im 3/73]
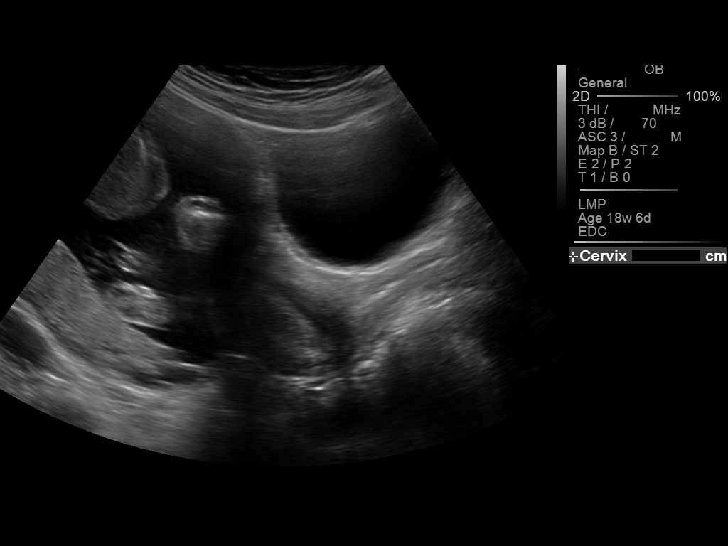
[im 9/73]
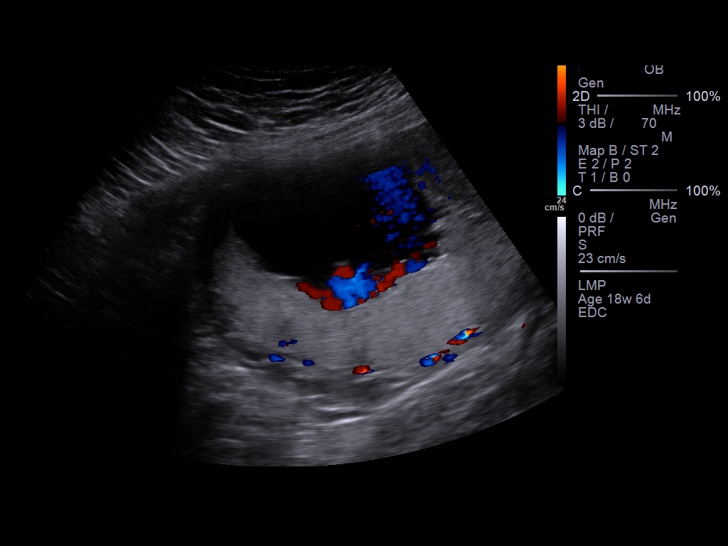
[im 14/73]
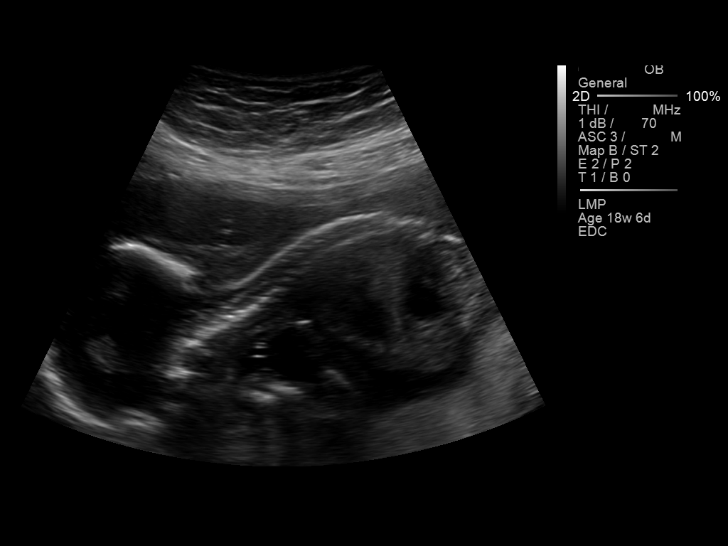
[im 19/73]
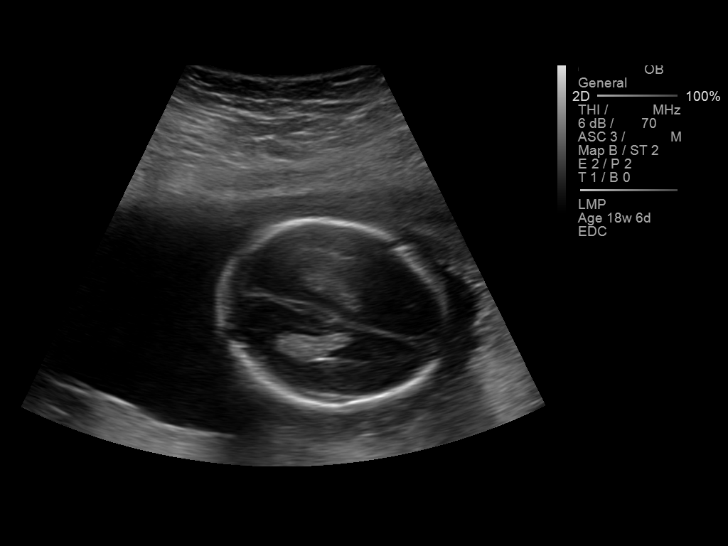
[im 25/73]
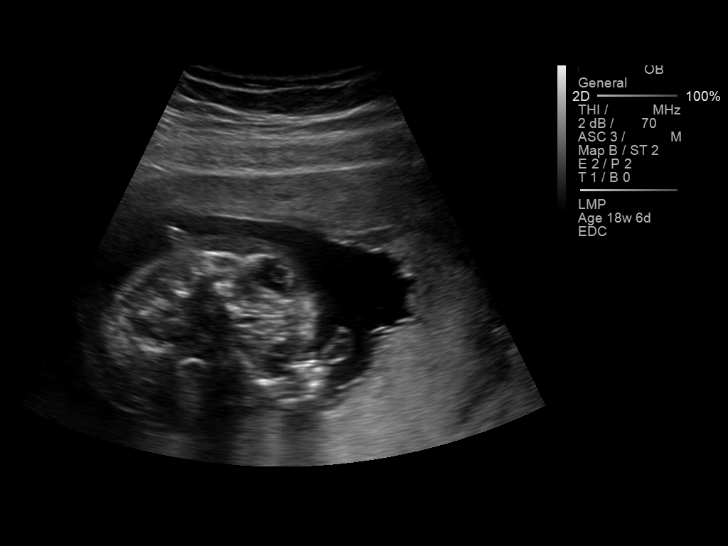
[im 30/73]
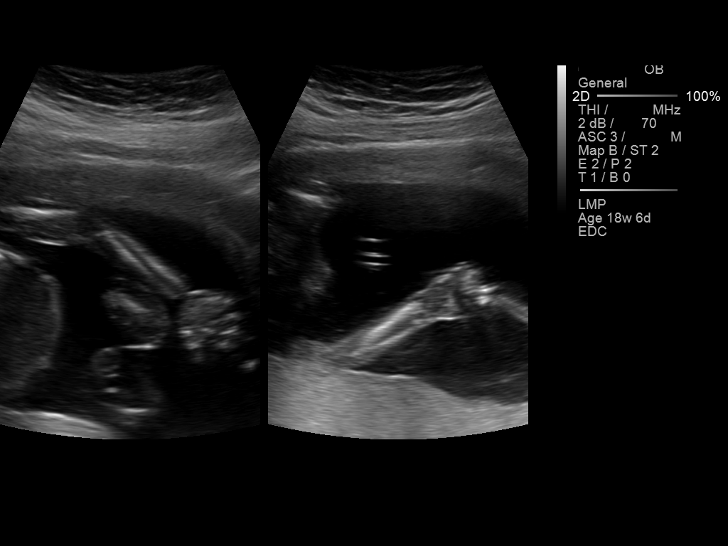
[im 35/73]
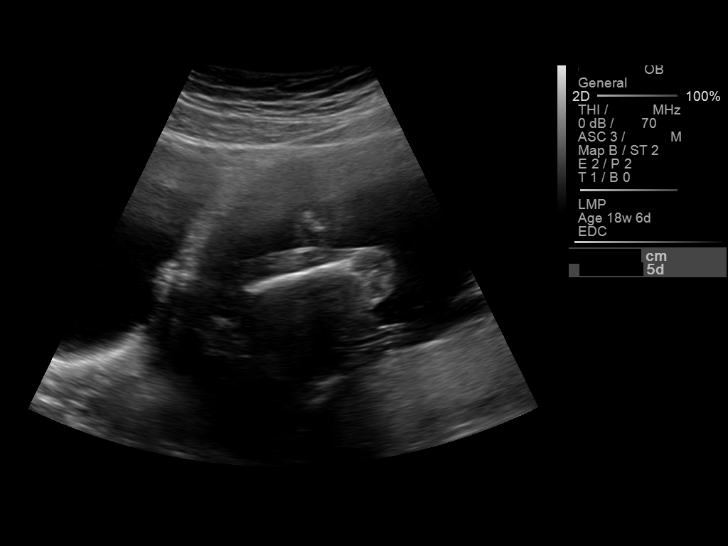
[im 41/73]
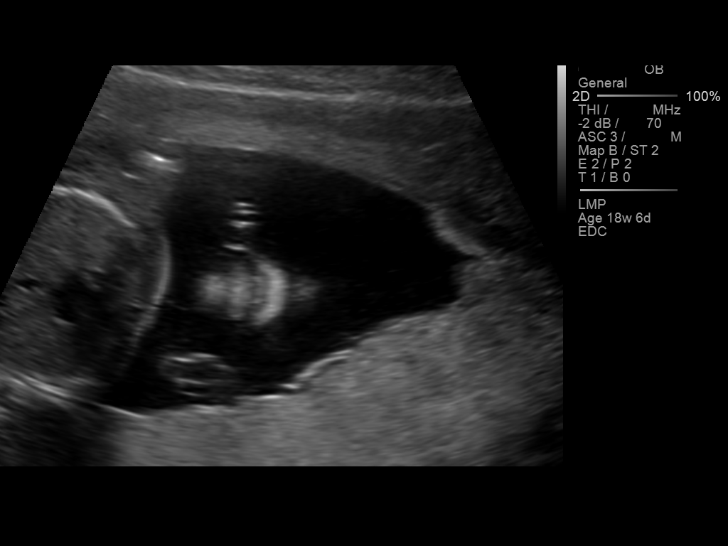
[im 46/73]
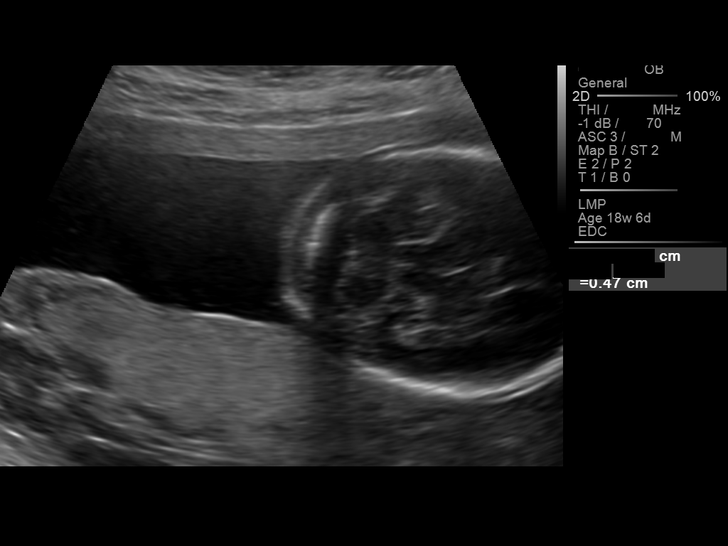
[im 51/73]
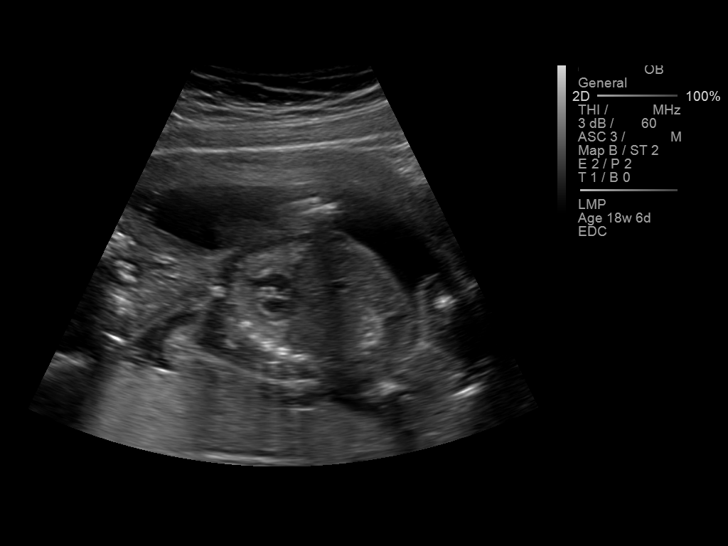
[im 57/73]
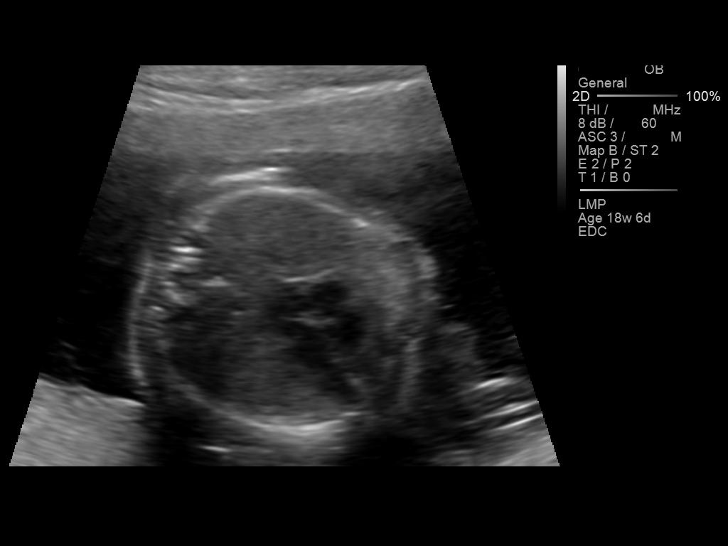
[im 62/73]
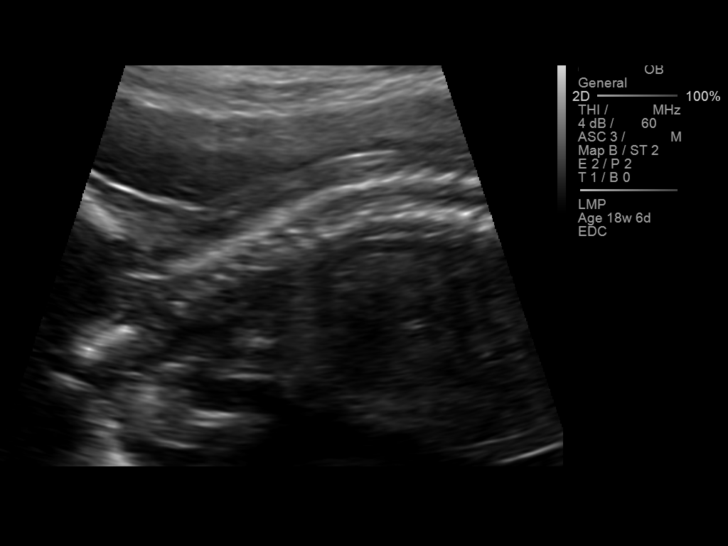
[im 67/73]
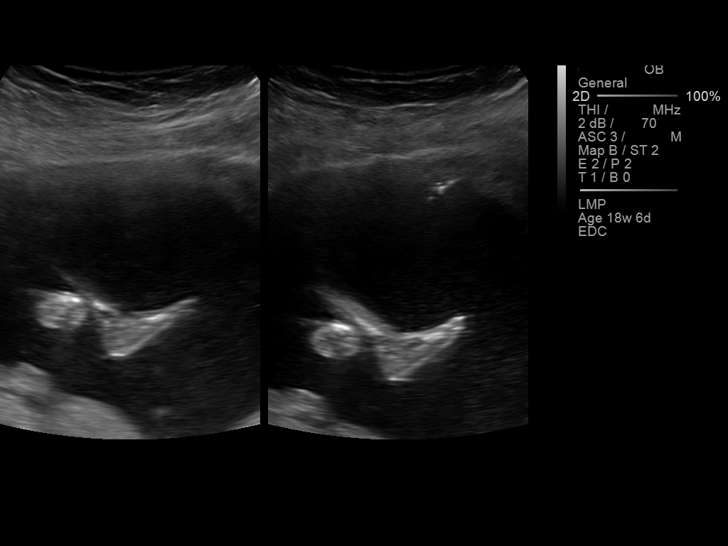
[im 73/73]
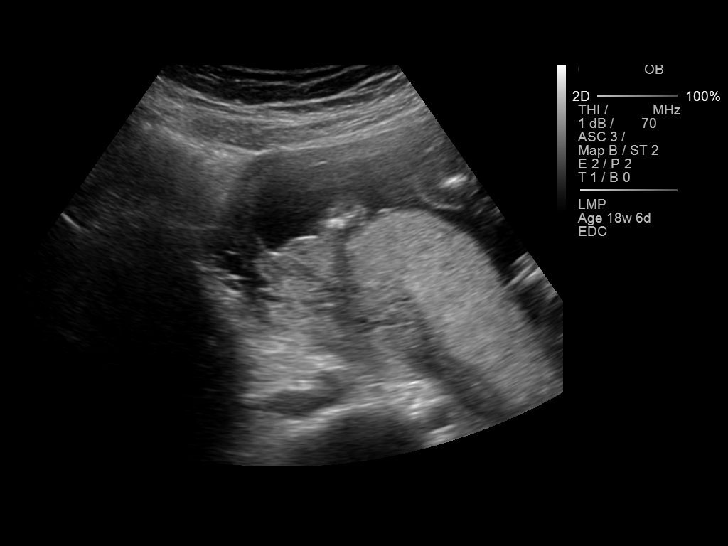

[14 of 28 positions shown; findings below may reference images not displayed]

Canned report from images found in remote index.

Refer to host system for actual result text.

## 2018-01-23 ENCOUNTER — Ambulatory Visit (INDEPENDENT_AMBULATORY_CARE_PROVIDER_SITE_OTHER): Payer: Medicaid Other | Admitting: Podiatry

## 2018-01-23 ENCOUNTER — Encounter: Payer: Self-pay | Admitting: Podiatry

## 2018-01-23 DIAGNOSIS — L600 Ingrowing nail: Secondary | ICD-10-CM | POA: Diagnosis not present

## 2018-01-23 NOTE — Patient Instructions (Signed)

## 2018-01-25 NOTE — Progress Notes (Signed)
   Subjective: Patient presents today for evaluation of pain to the medial border of the left great toe and medial border of the right 2nd toe that has been ongoing for more than one year. She reports associated erythema and swelling to the toes. Patient is concerned for possible ingrown nail. Applying pressure to the areas increases the pain. She has not done anything for treatment. Patient presents today for further treatment and evaluation.  No past medical history on file.  Objective:  General: Well developed, nourished, in no acute distress, alert and oriented x3   Dermatology: Skin is warm, dry and supple bilateral. Medial borders of the left great toe and right 2nd toe appears to be erythematous with evidence of an ingrowing nail. Pain on palpation noted to the border of the nail fold. The remaining nails appear unremarkable at this time. There are no open sores, lesions.  Vascular: Dorsalis Pedis artery and Posterior Tibial artery pedal pulses palpable. No lower extremity edema noted.   Neruologic: Grossly intact via light touch bilateral.  Musculoskeletal: Muscular strength within normal limits in all groups bilateral. Normal range of motion noted to all pedal and ankle joints.   Assesement: #1 Paronychia with ingrowing nail medial border right 2nd toe #2 Recurrent ingrown nail medial border left great toe #3 Pain in toes #4 Incurvated nails  Plan of Care:  1. Patient evaluated.  2. Discussed treatment alternatives and plan of care. Explained nail avulsion procedure and post procedure course to patient. 3. Patient opted for permanent partial nail avulsion.  4. Prior to procedure, local anesthesia infiltration utilized using 3 ml of a 50:50 mixture of 2% plain lidocaine and 0.5% plain marcaine in a normal hallux block fashion and a betadine prep performed.  5. Partial permanent nail avulsion with chemical matrixectomy performed using 3x30sec applications of phenol followed by  alcohol flush.  6. Light dressing applied. 7. Return to clinic in 2 weeks.   Felecia ShellingBrent M. Ahrianna Siglin, DPM [facility]  Dr. Felecia ShellingBrent M. Mikya Don, DPM    [address]                                        [place], [place] [address]                Office [phone]  Fax [phone]

## 2018-02-06 ENCOUNTER — Encounter: Payer: Medicaid Other | Admitting: Podiatry

## 2018-06-04 ENCOUNTER — Ambulatory Visit: Payer: Medicaid Other | Admitting: Internal Medicine

## 2018-06-04 ENCOUNTER — Telehealth: Payer: Self-pay | Admitting: Internal Medicine

## 2018-06-04 NOTE — Telephone Encounter (Signed)
Patient only has [place] Access.  Patient is to get in touch with DSS to switch to regular medicaid.  Once this switch has been completed and showing, patient will call back to schedule new patient appointment with Dr. Macario GoldsPlot.

## 2019-02-05 ENCOUNTER — Ambulatory Visit: Payer: Medicaid Other | Admitting: Internal Medicine

## 2019-03-06 ENCOUNTER — Ambulatory Visit: Payer: Medicaid Other | Admitting: Internal Medicine

## 2019-05-01 ENCOUNTER — Ambulatory Visit: Payer: Medicaid Other | Admitting: Internal Medicine

## 2019-05-27 ENCOUNTER — Ambulatory Visit: Payer: Self-pay | Admitting: Psychology

## 2019-05-27 ENCOUNTER — Ambulatory Visit: Payer: No Typology Code available for payment source | Admitting: Psychology

## 2022-08-17 ENCOUNTER — Ambulatory Visit (INDEPENDENT_AMBULATORY_CARE_PROVIDER_SITE_OTHER): Payer: 59 | Admitting: Internal Medicine

## 2022-08-17 ENCOUNTER — Encounter: Payer: Self-pay | Admitting: Internal Medicine

## 2022-08-17 VITALS — BP 102/68 | HR 68 | Temp 98.4°F | Ht 63.0 in | Wt 182.6 lb

## 2022-08-17 DIAGNOSIS — F410 Panic disorder [episodic paroxysmal anxiety] without agoraphobia: Secondary | ICD-10-CM

## 2022-08-17 DIAGNOSIS — D509 Iron deficiency anemia, unspecified: Secondary | ICD-10-CM

## 2022-08-17 DIAGNOSIS — R5383 Other fatigue: Secondary | ICD-10-CM

## 2022-08-17 DIAGNOSIS — R4589 Other symptoms and signs involving emotional state: Secondary | ICD-10-CM | POA: Insufficient documentation

## 2022-08-17 DIAGNOSIS — Z0000 Encounter for general adult medical examination without abnormal findings: Secondary | ICD-10-CM

## 2022-08-17 DIAGNOSIS — R69 Illness, unspecified: Secondary | ICD-10-CM | POA: Diagnosis not present

## 2022-08-17 LAB — URINALYSIS
Bilirubin Urine: NEGATIVE
Hgb urine dipstick: NEGATIVE
Ketones, ur: NEGATIVE
Leukocytes,Ua: NEGATIVE
Nitrite: NEGATIVE
Specific Gravity, Urine: 1.02 (ref 1.000–1.030)
Total Protein, Urine: NEGATIVE
Urine Glucose: NEGATIVE
Urobilinogen, UA: 0.2 (ref 0.0–1.0)
pH: 7 (ref 5.0–8.0)

## 2022-08-17 LAB — CBC WITH DIFFERENTIAL/PLATELET
Basophils Absolute: 0 10*3/uL (ref 0.0–0.1)
Basophils Relative: 0.9 % (ref 0.0–3.0)
Eosinophils Absolute: 0.2 10*3/uL (ref 0.0–0.7)
Eosinophils Relative: 3.7 % (ref 0.0–5.0)
HCT: 42.2 % (ref 36.0–46.0)
Hemoglobin: 14 g/dL (ref 12.0–15.0)
Lymphocytes Relative: 37.8 % (ref 12.0–46.0)
Lymphs Abs: 2 10*3/uL (ref 0.7–4.0)
MCHC: 33.2 g/dL (ref 30.0–36.0)
MCV: 86.6 fl (ref 78.0–100.0)
Monocytes Absolute: 0.3 10*3/uL (ref 0.1–1.0)
Monocytes Relative: 5.9 % (ref 3.0–12.0)
Neutro Abs: 2.8 10*3/uL (ref 1.4–7.7)
Neutrophils Relative %: 51.7 % (ref 43.0–77.0)
Platelets: 203 10*3/uL (ref 150.0–400.0)
RBC: 4.87 Mil/uL (ref 3.87–5.11)
RDW: 12.6 % (ref 11.5–15.5)
WBC: 5.4 10*3/uL (ref 4.0–10.5)

## 2022-08-17 LAB — COMPREHENSIVE METABOLIC PANEL
ALT: 15 U/L (ref 0–35)
AST: 15 U/L (ref 0–37)
Albumin: 4.3 g/dL (ref 3.5–5.2)
Alkaline Phosphatase: 53 U/L (ref 39–117)
BUN: 16 mg/dL (ref 6–23)
CO2: 27 mEq/L (ref 19–32)
Calcium: 9.4 mg/dL (ref 8.4–10.5)
Chloride: 102 mEq/L (ref 96–112)
Creatinine, Ser: 0.82 mg/dL (ref 0.40–1.20)
GFR: 93.78 mL/min (ref >60.00)
Glucose, Bld: 84 mg/dL (ref 70–99)
Potassium: 4 mEq/L (ref 3.5–5.1)
Sodium: 137 mEq/L (ref 135–145)
Total Bilirubin: 0.3 mg/dL (ref 0.2–1.2)
Total Protein: 7.6 g/dL (ref 6.0–8.3)

## 2022-08-17 LAB — LIPID PANEL
Cholesterol: 203 mg/dL — ABNORMAL HIGH (ref 0–200)
HDL: 53.1 mg/dL (ref >39.00)
LDL Cholesterol: 126 mg/dL — ABNORMAL HIGH (ref 0–99)
NonHDL: 149.6
Total CHOL/HDL Ratio: 4
Triglycerides: 120 mg/dL (ref 0.0–149.0)
VLDL: 24 mg/dL (ref 0.0–40.0)

## 2022-08-17 LAB — VITAMIN D 25 HYDROXY (VIT D DEFICIENCY, FRACTURES): VITD: 29.63 ng/mL — ABNORMAL LOW (ref 30.00–100.00)

## 2022-08-17 LAB — VITAMIN B12: Vitamin B-12: 335 pg/mL (ref 211–911)

## 2022-08-17 LAB — TSH: TSH: 1.07 u[IU]/mL (ref 0.35–5.50)

## 2022-08-17 MED ORDER — VITAMIN D3 50 MCG (2000 UT) PO CAPS: 2000.0000 [IU] | ORAL_CAPSULE | Freq: Every day | ORAL | 3 refills | Status: DC

## 2022-08-17 MED ORDER — ESCITALOPRAM OXALATE 5 MG PO TABS: 5.0000 mg | ORAL_TABLET | Freq: Every day | ORAL | 5 refills | Status: DC

## 2022-08-17 MED ORDER — LORAZEPAM 1 MG PO TABS: 1.0000 mg | ORAL_TABLET | Freq: Two times a day (BID) | ORAL | 1 refills | Status: DC | PRN

## 2022-08-17 NOTE — Progress Notes (Signed)
Subjective:  Patient ID: Victoria Cervantes, female    DOB: 05/03/1986  Age: 36 y.o. MRN: [id number]  CC: New Patient (Initial Visit)   HPI Sherrise Liberto presents for chronic fatigue, sleep problems, depressed mood, panic attacks - had 3 last week. Pt lost wt on diet...  Outpatient Medications Prior to Visit  Medication Sig Dispense Refill   acetaminophen (TYLENOL) 325 MG tablet Take 650 mg by mouth every 6 (six) hours as needed for mild pain.     ATORVASTATIN CALCIUM PO Take by mouth.     Cyclobenzaprine HCl (FLEXERIL PO) Take by mouth.     MELOXICAM PO Take by mouth.     Prenatal Vit-Fe Fumarate-FA (PRENATAL MULTIVITAMIN) TABS tablet Take 1 tablet by mouth daily at 12 noon.     sertraline (ZOLOFT) 50 MG tablet Take 1/2 tablet x 7 days then 1 tablet daily     venlafaxine (EFFEXOR) 37.5 MG tablet Take 37.5 mg by mouth daily.      No facility-administered medications prior to visit.    ROS: Review of Systems  Constitutional:  Positive for fatigue. Negative for activity change, appetite change, chills and unexpected weight change.  HENT:  Negative for congestion, mouth sores and sinus pressure.   Eyes:  Negative for visual disturbance.  Respiratory:  Negative for cough and chest tightness.   Gastrointestinal:  Negative for abdominal pain and nausea.  Genitourinary:  Negative for difficulty urinating, frequency and vaginal pain.  Musculoskeletal:  Negative for back pain and gait problem.  Skin:  Negative for pallor and rash.  Neurological:  Negative for dizziness, tremors, weakness, numbness and headaches.  Psychiatric/Behavioral:  Positive for dysphoric mood and sleep disturbance. Negative for confusion, self-injury and suicidal ideas. The patient is nervous/anxious. The patient is not hyperactive.     Objective:  BP 102/68 (BP Location: Left Arm)   Pulse 68   Temp 98.4 F (36.9 C) (Oral)   Ht 5\' 3"  (1.6 m)   Wt 182 lb 9.6 oz (82.8 kg)   SpO2 98%   BMI 32.35 kg/m   BP  Readings from Last 3 Encounters:  08/17/22 102/68  06/16/16 114/72  11/24/15 117/67    Wt Readings from Last 3 Encounters:  08/17/22 182 lb 9.6 oz (82.8 kg)  06/16/16 160 lb (72.6 kg)  06/29/15 151 lb (68.5 kg)    Physical Exam Constitutional:      General: She is not in acute distress.    Appearance: She is well-developed.  HENT:     Head: Normocephalic.     Right Ear: External ear normal.     Left Ear: External ear normal.     Nose: Nose normal.  Eyes:     General:        Right eye: No discharge.        Left eye: No discharge.     Conjunctiva/sclera: Conjunctivae normal.     Pupils: Pupils are equal, round, and reactive to light.  Neck:     Thyroid: No thyromegaly.     Vascular: No JVD.     Trachea: No tracheal deviation.  Cardiovascular:     Rate and Rhythm: Normal rate and regular rhythm.     Heart sounds: Normal heart sounds.  Pulmonary:     Effort: No respiratory distress.     Breath sounds: No stridor. No wheezing.  Abdominal:     General: Bowel sounds are normal. There is no distension.     Palpations: Abdomen is soft. There is  no mass.     Tenderness: There is no abdominal tenderness. There is no guarding or rebound.  Musculoskeletal:        General: No tenderness.     Cervical back: Normal range of motion and neck supple. No rigidity.  Lymphadenopathy:     Cervical: No cervical adenopathy.  Skin:    Findings: No erythema or rash.  Neurological:     Mental Status: She is oriented to person, place, and time.     Cranial Nerves: No cranial nerve deficit.     Motor: No abnormal muscle tone.     Coordination: Coordination normal.     Gait: Gait normal.     Deep Tendon Reflexes: Reflexes normal.  Psychiatric:        Behavior: Behavior normal.        Thought Content: Thought content normal.        Judgment: Judgment normal.     Lab Results  Component Value Date   WBC 11.5 (H) 11/23/2015   HGB 11.0 (L) 11/23/2015   HCT 33.4 (L) 11/23/2015   PLT 122  (L) 11/23/2015    No results found.  Assessment & Plan:   Problem List Items Addressed This Visit     Depressed mood    Chronic Start Lexapro at hs Check CBC, iron, B12      Iron deficiency anemia    Remote Check CBC, iron, B12      Other fatigue    Chronic Check CBC, iron, B12, TSH Treat depression      Relevant Orders   VITAMIN D 25 Hydroxy (Vit-D Deficiency, Fractures)   Vitamin B12   Iron, TIBC and Ferritin Panel   Panic attacks    Worse Start Lexapro Lorazepam prn      Relevant Medications   escitalopram (LEXAPRO) 5 MG tablet   LORazepam (ATIVAN) 1 MG tablet   Other Visit Diagnoses     Well adult exam    -  Primary   Relevant Orders   TSH   Urinalysis   Lipid panel   CBC with Differential/Platelet   Comprehensive metabolic panel   VITAMIN D 25 Hydroxy (Vit-D Deficiency, Fractures)   Vitamin B12         Meds ordered this encounter  Medications   Cholecalciferol (VITAMIN D3) 50 MCG (2000 UT) capsule    Sig: Take 1 capsule (2,000 Units total) by mouth daily.    Dispense:  100 capsule    Refill:  3   escitalopram (LEXAPRO) 5 MG tablet    Sig: Take 1 tablet (5 mg total) by mouth daily.    Dispense:  30 tablet    Refill:  5   LORazepam (ATIVAN) 1 MG tablet    Sig: Take 1 tablet (1 mg total) by mouth 2 (two) times daily as needed for anxiety.    Dispense:  60 tablet    Refill:  1      Follow-up: Return in about 2 months (around 10/17/2022) for a follow-up visit.  Sonda Primes, MD

## 2022-08-17 NOTE — Assessment & Plan Note (Signed)
Chronic Start Lexapro at hs Check CBC, iron, B12

## 2022-08-17 NOTE — Assessment & Plan Note (Addendum)
Worse Start Lexapro Lorazepam prn

## 2022-08-17 NOTE — Assessment & Plan Note (Signed)
Remote Check CBC, iron, B12

## 2022-08-17 NOTE — Assessment & Plan Note (Signed)
Chronic Check CBC, iron, B12, TSH Treat depression

## 2022-08-18 LAB — IRON,TIBC AND FERRITIN PANEL
%SAT: 25 % (calc) (ref 16–45)
Ferritin: 64 ng/mL (ref 16–154)
Iron: 89 ug/dL (ref 40–190)
TIBC: 355 mcg/dL (calc) (ref 250–450)

## 2022-08-22 ENCOUNTER — Telehealth: Payer: Self-pay

## 2022-08-22 MED ORDER — ESCITALOPRAM OXALATE 5 MG PO TABS: 5.0000 mg | ORAL_TABLET | Freq: Every day | ORAL | 5 refills | Status: DC

## 2022-08-22 MED ORDER — VITAMIN D3 50 MCG (2000 UT) PO CAPS: 2000.0000 [IU] | ORAL_CAPSULE | Freq: Every day | ORAL | 3 refills | Status: DC

## 2022-08-22 NOTE — Telephone Encounter (Signed)
Pt is calling to request prescriptions be picked up from another pharmacy:  Cholecalciferol (VITAMIN D3) 50 MCG (2000 UT) capsule escitalopram (LEXAPRO) 5 MG tablet LORazepam (ATIVAN) 1 MG tablet  Ph[facility]acy: [facility] [place], [place] 08/17/22

## 2022-08-22 NOTE — Telephone Encounter (Signed)
Resent Vit D &nLexapro. [facility]

## 2022-08-26 MED ORDER — LORAZEPAM 1 MG PO TABS: 1.0000 mg | ORAL_TABLET | Freq: Two times a day (BID) | ORAL | 1 refills | Status: DC | PRN

## 2022-08-26 MED ORDER — ESCITALOPRAM OXALATE 5 MG PO TABS: 5.0000 mg | ORAL_TABLET | Freq: Every day | ORAL | 5 refills | Status: DC

## 2022-08-26 MED ORDER — VITAMIN D3 50 MCG (2000 UT) PO CAPS: 2000.0000 [IU] | ORAL_CAPSULE | Freq: Every day | ORAL | 3 refills | Status: AC

## 2022-08-26 NOTE — Telephone Encounter (Signed)
Okay.  Done.  Thanks 

## 2023-01-18 ENCOUNTER — Ambulatory Visit (INDEPENDENT_AMBULATORY_CARE_PROVIDER_SITE_OTHER): Payer: 59 | Admitting: Internal Medicine

## 2023-01-18 ENCOUNTER — Encounter: Payer: Self-pay | Admitting: Internal Medicine

## 2023-01-18 VITALS — BP 102/70 | HR 65 | Temp 98.2°F | Ht 63.0 in | Wt 184.0 lb

## 2023-01-18 DIAGNOSIS — F419 Anxiety disorder, unspecified: Secondary | ICD-10-CM | POA: Diagnosis not present

## 2023-01-18 DIAGNOSIS — R4589 Other symptoms and signs involving emotional state: Secondary | ICD-10-CM

## 2023-01-18 DIAGNOSIS — R69 Illness, unspecified: Secondary | ICD-10-CM | POA: Diagnosis not present

## 2023-01-18 MED ORDER — ESCITALOPRAM OXALATE 5 MG PO TABS: 5.0000 mg | ORAL_TABLET | Freq: Every day | ORAL | 2 refills | Status: DC

## 2023-01-18 NOTE — Progress Notes (Signed)
Subjective:  Patient ID: Victoria Cervantes, female    DOB: [birth year]  Age: 37 y.o. MRN: [id number]  CC: No chief complaint on file.   HPI Ellaree Barthell presents for panic attacks Doing well on Lexapro  Outpatient Medications Prior to Visit  Medication Sig Dispense Refill   Cholecalciferol (VITAMIN D3) 50 MCG (2000 UT) capsule Take 1 capsule (2,000 Units total) by mouth daily. 100 capsule 3   LORazepam (ATIVAN) 1 MG tablet Take 1 tablet (1 mg total) by mouth 2 (two) times daily as needed for anxiety. 60 tablet 1   escitalopram (LEXAPRO) 5 MG tablet Take 1 tablet (5 mg total) by mouth daily. 30 tablet 5   No facility-administered medications prior to visit.    ROS: Review of Systems  Constitutional:  Negative for activity change, appetite change, chills, fatigue and unexpected weight change.  HENT:  Negative for congestion, mouth sores and sinus pressure.   Eyes:  Negative for visual disturbance.  Respiratory:  Negative for cough and chest tightness.   Gastrointestinal:  Negative for abdominal pain and nausea.  Genitourinary:  Negative for difficulty urinating, frequency and vaginal pain.  Musculoskeletal:  Negative for back pain and gait problem.  Skin:  Negative for pallor and rash.  Neurological:  Negative for dizziness, tremors, weakness, numbness and headaches.  Psychiatric/Behavioral:  Negative for confusion and sleep disturbance.     Objective:  BP 102/70 (BP Location: Right Arm, Patient Position: Sitting, Cuff Size: Large)   Pulse 65   Temp 98.2 F (36.8 C) (Oral)   Ht 5' 3"$  (1.6 m)   Wt 184 lb (83.5 kg)   LMP  (LMP Unknown)   SpO2 99%   Breastfeeding No   BMI 32.59 kg/m   BP Readings from Last 3 Encounters:  01/18/23 102/70  08/17/22 102/68  06/16/16 114/72    Wt Readings from Last 3 Encounters:  01/18/23 184 lb (83.5 kg)  08/17/22 182 lb 9.6 oz (82.8 kg)  06/16/16 160 lb (72.6 kg)    Physical Exam Constitutional:      General: She is not in acute  distress.    Appearance: She is well-developed.  HENT:     Head: Normocephalic.     Right Ear: External ear normal.     Left Ear: External ear normal.     Nose: Nose normal.  Eyes:     General:        Right eye: No discharge.        Left eye: No discharge.     Conjunctiva/sclera: Conjunctivae normal.     Pupils: Pupils are equal, round, and reactive to light.  Neck:     Thyroid: No thyromegaly.     Vascular: No JVD.     Trachea: No tracheal deviation.  Cardiovascular:     Rate and Rhythm: Normal rate and regular rhythm.     Heart sounds: Normal heart sounds.  Pulmonary:     Effort: No respiratory distress.     Breath sounds: No stridor. No wheezing.  Abdominal:     General: Bowel sounds are normal. There is no distension.     Palpations: Abdomen is soft. There is no mass.     Tenderness: There is no abdominal tenderness. There is no guarding or rebound.  Musculoskeletal:        General: No tenderness.     Cervical back: Normal range of motion and neck supple. No rigidity.  Lymphadenopathy:     Cervical: No cervical adenopathy.  Skin:  Findings: No erythema or rash.  Neurological:     Cranial Nerves: No cranial nerve deficit.     Motor: No abnormal muscle tone.     Coordination: Coordination normal.     Deep Tendon Reflexes: Reflexes normal.  Psychiatric:        Behavior: Behavior normal.        Thought Content: Thought content normal.        Judgment: Judgment normal.     Lab Results  Component Value Date   WBC 5.4 08/17/2022   HGB 14.0 08/17/2022   HCT 42.2 08/17/2022   PLT 203.0 08/17/2022   GLUCOSE 84 08/17/2022   CHOL 203 (H) 08/17/2022   TRIG 120.0 08/17/2022   HDL 53.10 08/17/2022   LDLCALC 126 (H) 08/17/2022   ALT 15 08/17/2022   AST 15 08/17/2022   NA 137 08/17/2022   K 4.0 08/17/2022   CL 102 08/17/2022   CREATININE 0.82 08/17/2022   BUN 16 08/17/2022   CO2 27 08/17/2022   TSH 1.07 08/17/2022    No results found.  Assessment & Plan:    Problem List Items Addressed This Visit       Other   Depressed mood    Doing well.  Continue Lexapro      Anxiety disorder - Primary    Doing well.  Continue Lexapro      Relevant Medications   escitalopram (LEXAPRO) 5 MG tablet      Meds ordered this encounter  Medications   escitalopram (LEXAPRO) 5 MG tablet    Sig: Take 1 tablet (5 mg total) by mouth daily.    Dispense:  90 tablet    Refill:  2      Follow-up: Return in about 1 year (around 01/19/2024) for a follow-up visit.  Walker Kehr, MD

## 2023-01-24 ENCOUNTER — Encounter: Payer: Self-pay | Admitting: Internal Medicine

## 2023-01-24 NOTE — Assessment & Plan Note (Signed)
Doing well.  Continue Lexapro

## 2023-01-25 ENCOUNTER — Encounter: Payer: Self-pay | Admitting: Internal Medicine

## 2023-01-25 ENCOUNTER — Other Ambulatory Visit: Payer: Self-pay | Admitting: Internal Medicine

## 2023-01-25 DIAGNOSIS — E66811 Obesity, class 1: Secondary | ICD-10-CM | POA: Insufficient documentation

## 2023-01-25 DIAGNOSIS — E669 Obesity, unspecified: Secondary | ICD-10-CM | POA: Insufficient documentation

## 2023-01-25 MED ORDER — PHENTERMINE HCL 37.5 MG PO TABS: 37.5000 mg | ORAL_TABLET | Freq: Every day | ORAL | 2 refills | Status: DC

## 2023-01-25 NOTE — Addendum Note (Signed)
Addended by: Rossie Muskrat on: 01/25/2023 04:11 PM   Modules accepted: Orders

## 2023-01-25 NOTE — Telephone Encounter (Signed)
[facility] per patient request for insurance.

## 2023-03-13 ENCOUNTER — Other Ambulatory Visit: Payer: Self-pay | Admitting: Internal Medicine

## 2023-04-17 ENCOUNTER — Other Ambulatory Visit: Payer: Self-pay | Admitting: Internal Medicine

## 2023-07-17 ENCOUNTER — Other Ambulatory Visit: Payer: Self-pay | Admitting: Internal Medicine

## 2023-07-27 ENCOUNTER — Other Ambulatory Visit: Payer: Self-pay | Admitting: *Deleted

## 2023-08-30 ENCOUNTER — Other Ambulatory Visit: Payer: Self-pay | Admitting: Internal Medicine

## 2023-09-14 DIAGNOSIS — N6311 Unspecified lump in the right breast, upper outer quadrant: Secondary | ICD-10-CM | POA: Diagnosis not present

## 2023-09-19 ENCOUNTER — Encounter: Payer: Self-pay | Admitting: Obstetrics

## 2023-09-19 ENCOUNTER — Other Ambulatory Visit: Payer: Self-pay | Admitting: Obstetrics

## 2023-09-19 DIAGNOSIS — Z1231 Encounter for screening mammogram for malignant neoplasm of breast: Secondary | ICD-10-CM

## 2023-09-19 DIAGNOSIS — N6311 Unspecified lump in the right breast, upper outer quadrant: Secondary | ICD-10-CM

## 2023-09-23 ENCOUNTER — Other Ambulatory Visit: Payer: Self-pay | Admitting: Internal Medicine

## 2023-10-03 ENCOUNTER — Ambulatory Visit
Admission: RE | Admit: 2023-10-03 | Discharge: 2023-10-03 | Disposition: A | Discharge status: Home / Self Care | Payer: 59 | Source: Ambulatory Visit | Attending: Obstetrics | Admitting: Obstetrics

## 2023-10-03 DIAGNOSIS — N6001 Solitary cyst of right breast: Secondary | ICD-10-CM | POA: Diagnosis not present

## 2023-10-03 DIAGNOSIS — R92343 Mammographic extreme density, bilateral breasts: Secondary | ICD-10-CM | POA: Diagnosis not present

## 2023-10-03 DIAGNOSIS — N6311 Unspecified lump in the right breast, upper outer quadrant: Secondary | ICD-10-CM | POA: Insufficient documentation

## 2023-10-03 DIAGNOSIS — N644 Mastodynia: Secondary | ICD-10-CM | POA: Diagnosis not present

## 2023-11-26 ENCOUNTER — Other Ambulatory Visit: Payer: Self-pay | Admitting: Internal Medicine

## 2023-12-03 ENCOUNTER — Ambulatory Visit
Admission: EM | Admit: 2023-12-03 | Discharge: 2023-12-03 | Disposition: A | Discharge status: Home / Self Care | Payer: 59 | Attending: Emergency Medicine | Admitting: Emergency Medicine

## 2023-12-03 DIAGNOSIS — J0100 Acute maxillary sinusitis, unspecified: Secondary | ICD-10-CM

## 2023-12-03 MED ORDER — AMOXICILLIN-POT CLAVULANATE 875-125 MG PO TABS: 1.0000 | ORAL_TABLET | Freq: Two times a day (BID) | ORAL | 0 refills | Status: DC

## 2023-12-03 NOTE — ED Provider Notes (Signed)
Renaldo Fiddler    CSN: [id number] Arrival date & time: 12/03/23  1146      History   Chief Complaint Chief Complaint  Patient presents with   Nasal Congestion    HPI Victoria Cervantes is a 37 y.o. female.  Patient presents with 10-day history of runny nose, sneezing, postnasal drip, cough.  She reports 2-day history of sinus pressure, sinus pain, increased congestion.  No fever or shortness of breath.  At the onset of her symptoms she was taking Mucinex but changed to Sudafed.  She has also been taking NyQuil.  The history is provided by the patient and medical records.    History reviewed. No pertinent past medical history.  Patient Active Problem List   Diagnosis Date Noted   Obesity (BMI 30.0-34.9) 01/25/2023   Panic attacks 08/17/2022   Iron deficiency anemia 08/17/2022   Other fatigue 08/17/2022   Depressed mood 08/17/2022   Ingrown toenail 06/19/2016   Anxiety disorder 05/04/2016   Cephalalgia 05/04/2016   Clinical depression 05/04/2016   Indication for care in labor or delivery 11/22/2015   Postpartum care following vaginal delivery 11/22/2015   Positive GBS test 11/04/2015   Rh negative status during pregnancy 06/19/2015   Encounter for supervision of normal first pregnancy in third trimester 06/18/2015    History reviewed. No pertinent surgical history.  OB History     Gravida  1   Para  1   Term  1   Preterm      AB      Living  1      SAB      IAB      Ectopic      Multiple  0   Live Births  1            Home Medications    Prior to Admission medications   Medication Sig Start Date End Date Taking? Authorizing Provider  amoxicillin-clavulanate (AUGMENTIN) 875-125 MG tablet Take 1 tablet by mouth every 12 (twelve) hours. 12/03/23  Yes Mickie Bail, NP  Cholecalciferol (VITAMIN D3) 50 MCG (2000 UT) capsule Take 1 capsule (2,000 Units total) by mouth daily. 08/26/22   Plotnikov, Georgina Quint, MD  escitalopram (LEXAPRO) 5 MG  tablet TAKE 1 TABLET BY MOUTH EVERY DAY 09/25/23   Plotnikov, Georgina Quint, MD  LORazepam (ATIVAN) 1 MG tablet TAKE 1 TABLET BY MOUTH 2 TIMES DAILY AS NEEDED FOR ANXIETY. 03/22/23   Plotnikov, Georgina Quint, MD  phentermine (ADIPEX-P) 37.5 MG tablet TAKE 1 TABLET BY MOUTH ONCE DAILY BEFORE BREAKFAST 11/28/23   Plotnikov, Georgina Quint, MD    Family History History reviewed. No pertinent family history.  Social History Social History   Tobacco Use   Smoking status: Never   Smokeless tobacco: Never  Substance Use Topics   Alcohol use: No   Drug use: No     Allergies   Patient has no known allergies.   Review of Systems Review of Systems  Constitutional:  Negative for chills and fever.  HENT:  Positive for congestion, postnasal drip, rhinorrhea, sinus pressure and sinus pain. Negative for ear pain and sore throat.   Respiratory:  Positive for cough. Negative for shortness of breath.      Physical Exam Triage Vital Signs ED Triage Vitals [12/03/23 1417]  Encounter Vitals Group     BP      Systolic BP Percentile      Diastolic BP Percentile      Pulse  Resp      Temp      Temp src      SpO2      Weight      Height      Head Circumference      Peak Flow      Pain Score 6     Pain Loc      Pain Education      Exclude from Growth Chart    No data found.  Updated Vital Signs BP 125/85   Pulse 76   Temp 98.6 F (37 C)   Resp 16   SpO2 98%   Visual Acuity Right Eye Distance:   Left Eye Distance:   Bilateral Distance:    Right Eye Near:   Left Eye Near:    Bilateral Near:     Physical Exam Constitutional:      General: She is not in acute distress. HENT:     Right Ear: Tympanic membrane normal.     Left Ear: Tympanic membrane normal.     Nose: Congestion present.     Mouth/Throat:     Mouth: Mucous membranes are moist.     Pharynx: Oropharynx is clear.  Cardiovascular:     Rate and Rhythm: Normal rate and regular rhythm.     Heart sounds: Normal heart  sounds.  Pulmonary:     Effort: Pulmonary effort is normal. No respiratory distress.     Breath sounds: Normal breath sounds.  Neurological:     Mental Status: She is alert.      UC Treatments / Results  Labs (all labs ordered are listed, but only abnormal results are displayed) Labs Reviewed - No data to display  EKG   Radiology No results found.  Procedures Procedures (including critical care time)  Medications Ordered in UC Medications - No data to display  Initial Impression / Assessment and Plan / UC Course  I have reviewed the triage vital signs and the nursing notes.  Pertinent labs & imaging results that were available during my care of the patient were reviewed by me and considered in my medical decision making (see chart for details).    Acute sinusitis.  Patient has been symptomatic for more than a week and her symptoms are getting worse despite OTC treatment.  Treating sinus infection with Augmentin.  Discussed Tylenol or ibuprofen as needed, Mucinex as needed.  Instructed her to follow-up with her PCP if she is not improving.  Education provided on sinus infection.  She agrees to plan of care.  Final Clinical Impressions(s) / UC Diagnoses   Final diagnoses:  Acute non-recurrent maxillary sinusitis     Discharge Instructions      Take the Augmentin as directed.  Follow-up with your primary care provider if your symptoms are not improving.      ED Prescriptions     Medication Sig Dispense Auth. Provider   amoxicillin-clavulanate (AUGMENTIN) 875-125 MG tablet Take 1 tablet by mouth every 12 (twelve) hours. 14 tablet Mickie Bail, NP      PDMP not reviewed this encounter.   Mickie Bail, NP 12/03/23 1430

## 2023-12-03 NOTE — ED Triage Notes (Signed)
Patient to Urgent Care with complaints of nasal congestion/ left sided facial pain radiates into ear and teeth. Denies any known fevers.  Reports symptoms started Friday, reports cold the week before.  Meds: Alternating sudafed and nyquil.

## 2023-12-03 NOTE — Discharge Instructions (Signed)
Take the Augmentin as directed.  Follow up with your primary care provider if your symptoms are not improving.

## 2024-02-22 ENCOUNTER — Other Ambulatory Visit: Payer: Self-pay | Admitting: Internal Medicine

## 2024-02-27 ENCOUNTER — Other Ambulatory Visit: Payer: Self-pay | Admitting: Obstetrics

## 2024-02-28 ENCOUNTER — Other Ambulatory Visit: Payer: Self-pay | Admitting: Obstetrics

## 2024-02-28 DIAGNOSIS — R921 Mammographic calcification found on diagnostic imaging of breast: Secondary | ICD-10-CM

## 2024-02-28 DIAGNOSIS — N63 Unspecified lump in unspecified breast: Secondary | ICD-10-CM

## 2024-03-22 ENCOUNTER — Other Ambulatory Visit: Payer: Self-pay | Admitting: Internal Medicine

## 2024-03-27 ENCOUNTER — Other Ambulatory Visit: Payer: Self-pay | Admitting: Internal Medicine

## 2024-04-05 ENCOUNTER — Ambulatory Visit
Admission: RE | Admit: 2024-04-05 | Discharge: 2024-04-05 | Disposition: A | Discharge status: Home / Self Care | Payer: Self-pay | Source: Ambulatory Visit | Attending: Obstetrics | Admitting: Obstetrics

## 2024-04-05 DIAGNOSIS — N63 Unspecified lump in unspecified breast: Secondary | ICD-10-CM

## 2024-04-05 DIAGNOSIS — R921 Mammographic calcification found on diagnostic imaging of breast: Secondary | ICD-10-CM

## 2024-05-02 ENCOUNTER — Other Ambulatory Visit: Payer: Self-pay | Admitting: Internal Medicine

## 2024-05-06 ENCOUNTER — Other Ambulatory Visit: Payer: Self-pay | Admitting: Internal Medicine

## 2024-06-11 ENCOUNTER — Other Ambulatory Visit: Payer: Self-pay | Admitting: Internal Medicine

## 2024-06-19 ENCOUNTER — Telehealth: Admitting: Internal Medicine

## 2024-06-19 ENCOUNTER — Encounter: Payer: Self-pay | Admitting: Internal Medicine

## 2024-06-19 DIAGNOSIS — R4589 Other symptoms and signs involving emotional state: Secondary | ICD-10-CM | POA: Diagnosis not present

## 2024-06-19 DIAGNOSIS — F419 Anxiety disorder, unspecified: Secondary | ICD-10-CM | POA: Diagnosis not present

## 2024-06-19 MED ORDER — LORAZEPAM 1 MG PO TABS: 1.0000 mg | ORAL_TABLET | Freq: Two times a day (BID) | ORAL | 1 refills | Status: AC | PRN

## 2024-06-19 MED ORDER — ESCITALOPRAM OXALATE 10 MG PO TABS: 10.0000 mg | ORAL_TABLET | Freq: Every day | ORAL | 5 refills | Status: DC

## 2024-06-19 NOTE — Progress Notes (Signed)
 Virtual Visit via Video Note  I connected with Victoria Cervantes on 06/19/24 at  1:40 PM EDT by a video enabled telemedicine application and verified that I am speaking with the correct person using two identifiers.   I discussed the limitations of evaluation and management by telemedicine and the availability of in person appointments. The patient expressed understanding and agreed to proceed.  I was located at our [facility] office. The patient was at home. There was no one else present in the visit.  Chief Complaint  Patient presents with   Medical Management of Chronic Issues    Discuss mental health decline and increase of anxiety attacks causing chest pains.. Pt is having issues with falling asleep and waking up, pt is also having issues with desire to do daily activities or having interest in anything.     History of Present Illness: C/o depression, anxiety - worse Working a lot  Review of Systems  Constitutional:  Negative for weight loss.  Cardiovascular:  Negative for chest pain and orthopnea.  Neurological:  Negative for tremors and seizures.  Psychiatric/Behavioral:  Positive for depression. Negative for memory loss, substance abuse and suicidal ideas. The patient is nervous/anxious and has insomnia.      Observations/Objective: The patient appears to be in no acute distress. Appears tired  Assessment and Plan:  Problem List Items Addressed This Visit     Anxiety disorder   Worse. Pt declined ref to psychology Increase Lexapro  to 10 mg/d Lorazepam  - rare use  Potential benefits of a long term benzodiazepines  use as well as potential risks  and complications were explained to the patient and were aknowledged.  Call in 2 wks      Relevant Medications   LORazepam  (ATIVAN ) 1 MG tablet   escitalopram  (LEXAPRO ) 10 MG tablet   Depressed mood - Primary   Worse. Pt declined ref to psychology Increase Lexapro  to 10 mg/d Call in 2 wks        Meds ordered this  encounter  Medications   LORazepam  (ATIVAN ) 1 MG tablet    Sig: Take 1 tablet (1 mg total) by mouth 2 (two) times daily as needed for anxiety.    Dispense:  30 tablet    Refill:  1   escitalopram  (LEXAPRO ) 10 MG tablet    Sig: Take 1 tablet (10 mg total) by mouth daily.    Dispense:  30 tablet    Refill:  5     Follow Up Instructions:    I discussed the assessment and treatment plan with the patient. The patient was provided an opportunity to ask questions and all were answered. The patient agreed with the plan and demonstrated an understanding of the instructions.   The patient was advised to call back or seek an in-person evaluation if the symptoms worsen or if the condition fails to improve as anticipated.  I provided face-to-face time during this encounter. We were at different locations.   Marolyn Noel, MD

## 2024-06-19 NOTE — Assessment & Plan Note (Signed)
 Worse. Pt declined ref to psychology Increase Lexapro  to 10 mg/d Lorazepam  - rare use  Potential benefits of a long term benzodiazepines  use as well as potential risks  and complications were explained to the patient and were aknowledged.  Call in 2 wks

## 2024-06-19 NOTE — Assessment & Plan Note (Addendum)
 Worse. Pt declined ref to psychology Increase Lexapro  to 10 mg/d Call in 2 wks

## 2024-07-12 ENCOUNTER — Other Ambulatory Visit: Payer: Self-pay | Admitting: Internal Medicine

## 2024-07-26 DIAGNOSIS — Z3202 Encounter for pregnancy test, result negative: Secondary | ICD-10-CM | POA: Diagnosis not present

## 2024-08-12 ENCOUNTER — Other Ambulatory Visit: Payer: Self-pay | Admitting: Internal Medicine

## 2024-08-12 NOTE — Telephone Encounter (Signed)
 Medication: Phentermine  Directions: TAKE 1 TABLET BY MOUTH ONCE DAILY BEFORE BREAKFAST  Last given: 06/14/2024 Number refills:  Last o/v: 0 Follow up: N/A Labs: 08/17/02

## 2024-09-10 ENCOUNTER — Other Ambulatory Visit: Payer: Self-pay | Admitting: Obstetrics

## 2024-09-10 DIAGNOSIS — R921 Mammographic calcification found on diagnostic imaging of breast: Secondary | ICD-10-CM

## 2024-09-10 DIAGNOSIS — N63 Unspecified lump in unspecified breast: Secondary | ICD-10-CM

## 2024-09-17 ENCOUNTER — Other Ambulatory Visit: Payer: Self-pay | Admitting: Internal Medicine

## 2024-12-10 ENCOUNTER — Other Ambulatory Visit: Payer: Self-pay | Admitting: Internal Medicine

## 2024-12-17 ENCOUNTER — Encounter: Payer: Self-pay | Admitting: Emergency Medicine

## 2024-12-17 ENCOUNTER — Ambulatory Visit
Admission: EM | Admit: 2024-12-17 | Discharge: 2024-12-17 | Disposition: A | Discharge status: Home / Self Care | Attending: Emergency Medicine | Admitting: Emergency Medicine

## 2024-12-17 DIAGNOSIS — J209 Acute bronchitis, unspecified: Secondary | ICD-10-CM | POA: Diagnosis not present

## 2024-12-17 HISTORY — DX: Depression, unspecified: F32.A

## 2024-12-17 HISTORY — DX: Anxiety disorder, unspecified: F41.9

## 2024-12-17 MED ORDER — AZITHROMYCIN 250 MG PO TABS: 250.0000 mg | ORAL_TABLET | Freq: Every day | ORAL | 0 refills | Status: AC

## 2024-12-17 MED ORDER — BENZONATATE 100 MG PO CAPS: 100.0000 mg | ORAL_CAPSULE | Freq: Three times a day (TID) | ORAL | 0 refills | Status: AC

## 2024-12-17 MED ORDER — PROMETHAZINE-DM 6.25-15 MG/5ML PO SYRP: 5.0000 mL | ORAL_SOLUTION | Freq: Every evening | ORAL | 0 refills | Status: AC | PRN

## 2024-12-17 MED ORDER — PREDNISONE 10 MG (21) PO TBPK: ORAL_TABLET | Freq: Every day | ORAL | 0 refills | Status: AC

## 2024-12-17 NOTE — ED Provider Notes (Signed)
 " CAY RALPH PELT    CSN: [id number] Arrival date & time: 12/17/24  1000      History   Chief Complaint Chief Complaint  Patient presents with   Sore Throat   Nasal Congestion   Hoarse    HPI Victoria Cervantes is a 39 y.o. female.   Patient presents for evaluation of subjective fever, chills, nasal congestion, bilateral ear pain, postnasal drip, rhinorrhea, sore throat, productive cough with shortness of breath and wheezing present for 5 days.  Shortness of breath occurring at rest and wheezing occurring intermittently.  No known sick contact prior.  Tolerable to food and liquids.  Has attempted DayQuil and NyQuil.  Denies respiratory history, non-smoker.  Denying abdominal symptoms.    Past Medical History:  Diagnosis Date   Anxiety    Depression     Patient Active Problem List   Diagnosis Date Noted   Obesity (BMI 30.0-34.9) 01/25/2023   Panic attacks 08/17/2022   Iron deficiency anemia 08/17/2022   Other fatigue 08/17/2022   Depressed mood 08/17/2022   Ingrown toenail 06/19/2016   Anxiety disorder 05/04/2016   Cephalalgia 05/04/2016   Clinical depression 05/04/2016   Indication for care in labor or delivery 11/22/2015   Postpartum care following vaginal delivery 11/22/2015   Positive GBS test 11/04/2015   Rh negative status during pregnancy 06/19/2015   Encounter for supervision of normal first pregnancy in third trimester 06/18/2015    History reviewed. No pertinent surgical history.  OB History     Gravida  1   Para  1   Term  1   Preterm      AB      Living  1      SAB      IAB      Ectopic      Multiple  0   Live Births  1            Home Medications    Prior to Admission medications  Medication Sig Start Date End Date Taking? Authorizing Provider  Cholecalciferol (VITAMIN D3) 50 MCG (2000 UT) capsule Take 1 capsule (2,000 Units total) by mouth daily. 08/26/22   Plotnikov, Karlynn GAILS, MD  escitalopram  (LEXAPRO ) 10 MG tablet  TAKE 1 TABLET BY MOUTH EVERY DAY 07/14/24   Plotnikov, Aleksei V, MD  LORazepam  (ATIVAN ) 1 MG tablet Take 1 tablet (1 mg total) by mouth 2 (two) times daily as needed for anxiety. 06/19/24   Plotnikov, Karlynn GAILS, MD  phentermine  (ADIPEX-P ) 37.5 MG tablet TAKE 1 TABLET BY MOUTH ONCE DAILY BEFORE BREAKFAST 12/11/24   Plotnikov, Karlynn GAILS, MD    Family History Family History  Problem Relation Age of Onset   Breast cancer Neg Hx     Social History Social History[1]   Allergies   Patient has no known allergies.   Review of Systems Review of Systems  Constitutional:  Positive for chills and fever. Negative for activity change, appetite change, diaphoresis, fatigue and unexpected weight change.  HENT:  Positive for congestion, ear pain, postnasal drip, rhinorrhea and sore throat. Negative for dental problem, drooling, ear discharge, facial swelling, hearing loss, mouth sores, nosebleeds, sinus pressure, sinus pain, sneezing, tinnitus, trouble swallowing and voice change.   Respiratory:  Positive for cough, shortness of breath and wheezing. Negative for apnea, choking, chest tightness and stridor.   Cardiovascular: Negative.   Gastrointestinal: Negative.      Physical Exam Triage Vital Signs ED Triage Vitals  Encounter Vitals Group  BP 12/17/24 1228 107/71     Girls Systolic BP Percentile --      Girls Diastolic BP Percentile --      Boys Systolic BP Percentile --      Boys Diastolic BP Percentile --      Pulse Rate 12/17/24 1228 68     Resp 12/17/24 1228 20     Temp 12/17/24 1228 98.1 F (36.7 C)     Temp Source 12/17/24 1228 Oral     SpO2 12/17/24 1228 98 %     Weight --      Height --      Head Circumference --      Peak Flow --      Pain Score 12/17/24 1226 4     Pain Loc --      Pain Education --      Exclude from Growth Chart --    No data found.  Updated Vital Signs BP 107/71 (BP Location: Right Arm)   Pulse 68   Temp 98.1 F (36.7 C) (Oral)   Resp 20   SpO2  98%   Visual Acuity Right Eye Distance:   Left Eye Distance:   Bilateral Distance:    Right Eye Near:   Left Eye Near:    Bilateral Near:     Physical Exam Constitutional:      Appearance: Normal appearance. She is well-developed.  HENT:     Head: Normocephalic.     Right Ear: Tympanic membrane and ear canal normal.     Left Ear: Tympanic membrane and ear canal normal.     Nose: Congestion present.     Mouth/Throat:     Pharynx: No oropharyngeal exudate or posterior oropharyngeal erythema.  Cardiovascular:     Rate and Rhythm: Normal rate and regular rhythm.     Pulses: Normal pulses.     Heart sounds: Normal heart sounds.  Pulmonary:     Effort: Pulmonary effort is normal.     Breath sounds: Normal breath sounds.  Neurological:     Mental Status: She is alert and oriented to person, place, and time.      UC Treatments / Results  Labs (all labs ordered are listed, but only abnormal results are displayed) Labs Reviewed - No data to display  EKG   Radiology No results found.  Procedures Procedures (including critical care time)  Medications Ordered in UC Medications - No data to display  Initial Impression / Assessment and Plan / UC Course  I have reviewed the triage vital signs and the nursing notes.  Pertinent labs & imaging results that were available during my care of the patient were reviewed by me and considered in my medical decision making (see chart for details).  Acute bronchitis  Patient is in no signs of distress nor toxic appearing.  Vital signs are stable.  Low suspicion for pneumonia therefore imaging deferred.  Viral testing deferred due to timeline.  Experiencing shortness of breath and wheezing at home concerning for bronchitis empirically placed on azithromycin  and additionally prescribed redness on, Tessalon  and Promethazine  DM.May use additional over-the-counter medications as needed for supportive care.  May follow-up with urgent care as  needed if symptoms persist or worsen.   Final Clinical Impressions(s) / UC Diagnoses   Final diagnoses:  None   Discharge Instructions   None    ED Prescriptions   None    PDMP not reviewed this encounter.     [1]  Social History Tobacco  Use   Smoking status: Never   Smokeless tobacco: Never  Vaping Use   Vaping status: Never Used  Substance Use Topics   Alcohol use: No   Drug use: No     Teresa Shelba SAUNDERS, NP 12/17/24 1426  "

## 2024-12-17 NOTE — Discharge Instructions (Addendum)
 On exam today the your lungs are clear and you are getting enough air without assistance low suspicion for pneumonia however concerning for bronchitis which is an inflammatory condition of the upper airways are as you are experiencing shortness of breath and wheezing at home  Begin azithromycin  to provide coverage for bacteria  Begin prednisone  every morning with food as directed, this medicine helps to reduce inflammation throughout the airway making it easier to breathe  You may use Tessalon  pill every 8 hours as needed for cough and may use cough syrup at bedtime to allow for rest    You can take Tylenol  and/or Ibuprofen  as needed for fever reduction and pain relief.   For cough: honey 1/2 to 1 teaspoon (you can dilute the honey in water or another fluid).  You can also use guaifenesin and dextromethorphan for cough. You can use a humidifier for chest congestion and cough.  If you don't have a humidifier, you can sit in the bathroom with the hot shower running.      For sore throat: try warm salt water gargles, cepacol lozenges, throat spray, warm tea or water with lemon/honey, popsicles or ice, or OTC cold relief medicine for throat discomfort.   For congestion: take a daily anti-histamine like Zyrtec, Claritin, and a oral decongestant, such as pseudoephedrine.  You can also use Flonase 1-2 sprays in each nostril daily.   It is important to stay hydrated: drink plenty of fluids (water, gatorade/powerade/pedialyte, juices, or teas) to keep your throat moisturized and help further relieve irritation/discomfort.

## 2024-12-17 NOTE — ED Triage Notes (Signed)
 Patient reports sore throat, runny nose, hoarse and chest congestion x 5 days. Patient has taken Dayquil and Nyquil with no relief. Rates pain 4/10.

## 2024-12-19 ENCOUNTER — Encounter: Payer: Self-pay | Admitting: Internal Medicine

## 2024-12-22 ENCOUNTER — Other Ambulatory Visit: Payer: Self-pay | Admitting: Internal Medicine

## 2024-12-22 MED ORDER — ALBUTEROL SULFATE HFA 108 (90 BASE) MCG/ACT IN AERS: 2.0000 | INHALATION_SPRAY | RESPIRATORY_TRACT | 1 refills | Status: AC | PRN
